# Patient Record
Sex: Female | Born: 1935 | Race: White | Hispanic: No | Smoking: Current every day smoker
Health system: Southern US, Community
[De-identification: ages and names within clinical notes are randomized; demographics above are authoritative.]

## PROBLEM LIST (undated history)

## (undated) DIAGNOSIS — I10 Essential (primary) hypertension: Secondary | ICD-10-CM

## (undated) DIAGNOSIS — Z972 Presence of dental prosthetic device (complete) (partial): Secondary | ICD-10-CM

## (undated) DIAGNOSIS — C50919 Malignant neoplasm of unspecified site of unspecified female breast: Secondary | ICD-10-CM

## (undated) DIAGNOSIS — Z86718 Personal history of other venous thrombosis and embolism: Secondary | ICD-10-CM

## (undated) DIAGNOSIS — R413 Other amnesia: Secondary | ICD-10-CM

## (undated) HISTORY — PX: NO PAST SURGERIES: SHX2092

---

## 2005-05-20 ENCOUNTER — Ambulatory Visit: Payer: Self-pay | Admitting: Family Medicine

## 2005-06-04 ENCOUNTER — Ambulatory Visit: Payer: Self-pay | Admitting: Family Medicine

## 2007-08-09 ENCOUNTER — Ambulatory Visit: Payer: Self-pay | Admitting: Emergency Medicine

## 2012-03-30 ENCOUNTER — Ambulatory Visit: Payer: Self-pay

## 2012-03-30 LAB — PROTIME-INR
INR: 2.6
Prothrombin Time: 27.7 secs — ABNORMAL HIGH (ref 11.5–14.7)

## 2018-11-26 ENCOUNTER — Emergency Department: Payer: Medicare Other

## 2018-11-26 ENCOUNTER — Encounter
Admission: EM | Disposition: A | Discharge status: Skilled Nursing Facility | Payer: Self-pay | Source: Home / Self Care | Attending: Internal Medicine

## 2018-11-26 ENCOUNTER — Inpatient Hospital Stay: Payer: Medicare Other | Admitting: Anesthesiology

## 2018-11-26 ENCOUNTER — Other Ambulatory Visit: Payer: Self-pay

## 2018-11-26 ENCOUNTER — Inpatient Hospital Stay: Payer: Medicare Other

## 2018-11-26 ENCOUNTER — Inpatient Hospital Stay
Admission: EM | Admit: 2018-11-26 | Discharge: 2018-11-30 | DRG: 481 | Disposition: A | Discharge status: Skilled Nursing Facility | Payer: Medicare Other | Attending: Internal Medicine | Admitting: Internal Medicine

## 2018-11-26 ENCOUNTER — Encounter: Payer: Self-pay | Admitting: Emergency Medicine

## 2018-11-26 DIAGNOSIS — D62 Acute posthemorrhagic anemia: Secondary | ICD-10-CM | POA: Diagnosis not present

## 2018-11-26 DIAGNOSIS — Z8249 Family history of ischemic heart disease and other diseases of the circulatory system: Secondary | ICD-10-CM

## 2018-11-26 DIAGNOSIS — Z01811 Encounter for preprocedural respiratory examination: Secondary | ICD-10-CM | POA: Diagnosis present

## 2018-11-26 DIAGNOSIS — F0390 Unspecified dementia without behavioral disturbance: Secondary | ICD-10-CM | POA: Diagnosis present

## 2018-11-26 DIAGNOSIS — W001XXA Fall from stairs and steps due to ice and snow, initial encounter: Secondary | ICD-10-CM | POA: Diagnosis present

## 2018-11-26 DIAGNOSIS — N183 Chronic kidney disease, stage 3 (moderate): Secondary | ICD-10-CM | POA: Diagnosis present

## 2018-11-26 DIAGNOSIS — S7222XA Displaced subtrochanteric fracture of left femur, initial encounter for closed fracture: Secondary | ICD-10-CM | POA: Diagnosis present

## 2018-11-26 DIAGNOSIS — I129 Hypertensive chronic kidney disease with stage 1 through stage 4 chronic kidney disease, or unspecified chronic kidney disease: Secondary | ICD-10-CM | POA: Diagnosis present

## 2018-11-26 DIAGNOSIS — Z86718 Personal history of other venous thrombosis and embolism: Secondary | ICD-10-CM | POA: Diagnosis not present

## 2018-11-26 DIAGNOSIS — S72002A Fracture of unspecified part of neck of left femur, initial encounter for closed fracture: Secondary | ICD-10-CM | POA: Diagnosis present

## 2018-11-26 DIAGNOSIS — Z419 Encounter for procedure for purposes other than remedying health state, unspecified: Secondary | ICD-10-CM

## 2018-11-26 DIAGNOSIS — Z79899 Other long term (current) drug therapy: Secondary | ICD-10-CM | POA: Diagnosis not present

## 2018-11-26 DIAGNOSIS — S42202A Unspecified fracture of upper end of left humerus, initial encounter for closed fracture: Secondary | ICD-10-CM | POA: Diagnosis present

## 2018-11-26 DIAGNOSIS — Z7901 Long term (current) use of anticoagulants: Secondary | ICD-10-CM

## 2018-11-26 DIAGNOSIS — N179 Acute kidney failure, unspecified: Secondary | ICD-10-CM | POA: Diagnosis present

## 2018-11-26 DIAGNOSIS — R509 Fever, unspecified: Secondary | ICD-10-CM | POA: Diagnosis present

## 2018-11-26 DIAGNOSIS — Z853 Personal history of malignant neoplasm of breast: Secondary | ICD-10-CM | POA: Diagnosis not present

## 2018-11-26 DIAGNOSIS — Y92009 Unspecified place in unspecified non-institutional (private) residence as the place of occurrence of the external cause: Secondary | ICD-10-CM | POA: Diagnosis not present

## 2018-11-26 DIAGNOSIS — F17200 Nicotine dependence, unspecified, uncomplicated: Secondary | ICD-10-CM | POA: Diagnosis present

## 2018-11-26 HISTORY — PX: INTRAMEDULLARY (IM) NAIL INTERTROCHANTERIC: SHX5875

## 2018-11-26 HISTORY — DX: Malignant neoplasm of unspecified site of unspecified female breast: C50.919

## 2018-11-26 LAB — HEMOGLOBIN A1C
HEMOGLOBIN A1C: 5.2 % (ref 4.8–5.6)
Mean Plasma Glucose: 102.54 mg/dL

## 2018-11-26 LAB — CBC WITH DIFFERENTIAL/PLATELET
Abs Immature Granulocytes: 0.11 10*3/uL — ABNORMAL HIGH (ref 0.00–0.07)
BASOS PCT: 0 %
Basophils Absolute: 0.1 10*3/uL (ref 0.0–0.1)
Eosinophils Absolute: 0.4 10*3/uL (ref 0.0–0.5)
Eosinophils Relative: 2 %
HCT: 40.9 % (ref 36.0–46.0)
Hemoglobin: 12.8 g/dL (ref 12.0–15.0)
Immature Granulocytes: 1 %
Lymphocytes Relative: 8 %
Lymphs Abs: 1.4 10*3/uL (ref 0.7–4.0)
MCH: 28.6 pg (ref 26.0–34.0)
MCHC: 31.3 g/dL (ref 30.0–36.0)
MCV: 91.3 fL (ref 80.0–100.0)
Monocytes Absolute: 0.9 10*3/uL (ref 0.1–1.0)
Monocytes Relative: 5 %
NEUTROS ABS: 14.4 10*3/uL — AB (ref 1.7–7.7)
Neutrophils Relative %: 84 %
Platelets: 208 10*3/uL (ref 150–400)
RBC: 4.48 MIL/uL (ref 3.87–5.11)
RDW: 13.2 % (ref 11.5–15.5)
WBC: 17.1 10*3/uL — ABNORMAL HIGH (ref 4.0–10.5)
nRBC: 0 % (ref 0.0–0.2)

## 2018-11-26 LAB — BASIC METABOLIC PANEL
Anion gap: 9 (ref 5–15)
BUN: 17 mg/dL (ref 8–23)
CO2: 22 mmol/L (ref 22–32)
Calcium: 8.6 mg/dL — ABNORMAL LOW (ref 8.9–10.3)
Chloride: 108 mmol/L (ref 98–111)
Creatinine, Ser: 1.18 mg/dL — ABNORMAL HIGH (ref 0.44–1.00)
GFR calc Af Amer: 50 mL/min — ABNORMAL LOW (ref >60)
GFR calc non Af Amer: 44 mL/min — ABNORMAL LOW (ref >60)
GLUCOSE: 179 mg/dL — AB (ref 70–99)
Potassium: 4 mmol/L (ref 3.5–5.1)
Sodium: 139 mmol/L (ref 135–145)

## 2018-11-26 LAB — PROTIME-INR
INR: 1
Prothrombin Time: 13.1 seconds (ref 11.4–15.2)

## 2018-11-26 SURGERY — FIXATION, FRACTURE, INTERTROCHANTERIC, WITH INTRAMEDULLARY ROD
Anesthesia: General | Laterality: Left

## 2018-11-26 MED ORDER — DIPHENHYDRAMINE HCL 12.5 MG/5ML PO ELIX: 12.5000 mg | ORAL_SOLUTION | ORAL | Status: DC | PRN

## 2018-11-26 MED ORDER — MORPHINE SULFATE (PF) 4 MG/ML IV SOLN
4.0000 mg | Freq: Once | INTRAVENOUS | Status: AC
  Administered 2018-11-26: 4 mg via INTRAVENOUS
  Filled 2018-11-26: qty 1

## 2018-11-26 MED ORDER — TRAMADOL HCL 50 MG PO TABS
50.0000 mg | ORAL_TABLET | Freq: Four times a day (QID) | ORAL | Status: DC | PRN
  Administered 2018-11-27 – 2018-11-30 (×2): 50 mg via ORAL
  Filled 2018-11-26 (×2): qty 1

## 2018-11-26 MED ORDER — ROCURONIUM BROMIDE 100 MG/10ML IV SOLN
INTRAVENOUS | Status: DC | PRN
  Administered 2018-11-26: 5 mg via INTRAVENOUS
  Administered 2018-11-26: 30 mg via INTRAVENOUS
  Administered 2018-11-26: 15 mg via INTRAVENOUS

## 2018-11-26 MED ORDER — DOCUSATE SODIUM 100 MG PO CAPS
100.0000 mg | ORAL_CAPSULE | Freq: Two times a day (BID) | ORAL | Status: DC
  Administered 2018-11-26 – 2018-11-30 (×8): 100 mg via ORAL
  Filled 2018-11-26 (×8): qty 1

## 2018-11-26 MED ORDER — OXYCODONE HCL 5 MG PO TABS
2.5000 mg | ORAL_TABLET | ORAL | Status: DC | PRN
  Administered 2018-11-27 – 2018-11-30 (×10): 5 mg via ORAL
  Filled 2018-11-26 (×10): qty 1

## 2018-11-26 MED ORDER — SODIUM CHLORIDE 0.9 % IV SOLN
INTRAVENOUS | Status: DC | PRN
  Administered 2018-11-26: 25 ug/min via INTRAVENOUS

## 2018-11-26 MED ORDER — ACETAMINOPHEN 325 MG PO TABS
325.0000 mg | ORAL_TABLET | Freq: Four times a day (QID) | ORAL | Status: DC | PRN
  Administered 2018-11-28: 650 mg via ORAL
  Filled 2018-11-26: qty 2

## 2018-11-26 MED ORDER — BISACODYL 10 MG RE SUPP
10.0000 mg | Freq: Every day | RECTAL | Status: DC | PRN
  Administered 2018-11-29: 10 mg via RECTAL
  Filled 2018-11-26: qty 1

## 2018-11-26 MED ORDER — SODIUM CHLORIDE 0.9 % IV SOLN
INTRAVENOUS | Status: DC
  Administered 2018-11-26: 23:00:00 via INTRAVENOUS

## 2018-11-26 MED ORDER — MIDAZOLAM HCL 2 MG/2ML IJ SOLN
INTRAMUSCULAR | Status: AC
  Filled 2018-11-26: qty 2

## 2018-11-26 MED ORDER — CEFAZOLIN SODIUM 1 G IJ SOLR
INTRAMUSCULAR | Status: AC
  Filled 2018-11-26: qty 20

## 2018-11-26 MED ORDER — ACETAMINOPHEN 650 MG RE SUPP: 650.0000 mg | Freq: Four times a day (QID) | RECTAL | Status: DC | PRN

## 2018-11-26 MED ORDER — METOCLOPRAMIDE HCL 10 MG PO TABS: 5.0000 mg | ORAL_TABLET | Freq: Three times a day (TID) | ORAL | Status: DC | PRN

## 2018-11-26 MED ORDER — AMLODIPINE BESYLATE 5 MG PO TABS: 5.0000 mg | ORAL_TABLET | Freq: Every day | ORAL | Status: DC

## 2018-11-26 MED ORDER — ONDANSETRON HCL 4 MG/2ML IJ SOLN
INTRAMUSCULAR | Status: DC | PRN
  Administered 2018-11-26: 4 mg via INTRAVENOUS

## 2018-11-26 MED ORDER — METHOCARBAMOL 500 MG PO TABS: 500.0000 mg | ORAL_TABLET | Freq: Four times a day (QID) | ORAL | Status: DC | PRN

## 2018-11-26 MED ORDER — LIDOCAINE HCL (PF) 2 % IJ SOLN
INTRAMUSCULAR | Status: AC
  Filled 2018-11-26: qty 10

## 2018-11-26 MED ORDER — SUGAMMADEX SODIUM 200 MG/2ML IV SOLN
INTRAVENOUS | Status: DC | PRN
  Administered 2018-11-26: 200 mg via INTRAVENOUS

## 2018-11-26 MED ORDER — FLEET ENEMA 7-19 GM/118ML RE ENEM: 1.0000 | ENEMA | Freq: Once | RECTAL | Status: DC | PRN

## 2018-11-26 MED ORDER — FENTANYL CITRATE (PF) 100 MCG/2ML IJ SOLN
INTRAMUSCULAR | Status: DC | PRN
  Administered 2018-11-26: 50 ug via INTRAVENOUS
  Administered 2018-11-26 (×2): 25 ug via INTRAVENOUS

## 2018-11-26 MED ORDER — ONDANSETRON HCL 4 MG/2ML IJ SOLN: 4.0000 mg | Freq: Four times a day (QID) | INTRAMUSCULAR | Status: DC | PRN

## 2018-11-26 MED ORDER — RIVAROXABAN 10 MG PO TABS
10.0000 mg | ORAL_TABLET | Freq: Every evening | ORAL | Status: DC
  Administered 2018-11-27 – 2018-11-30 (×4): 10 mg via ORAL
  Filled 2018-11-26 (×4): qty 1

## 2018-11-26 MED ORDER — GLYCOPYRROLATE 0.2 MG/ML IJ SOLN
INTRAMUSCULAR | Status: DC | PRN
  Administered 2018-11-26: 0.2 mg via INTRAVENOUS

## 2018-11-26 MED ORDER — ONDANSETRON HCL 4 MG/2ML IJ SOLN
INTRAMUSCULAR | Status: AC
  Filled 2018-11-26: qty 2

## 2018-11-26 MED ORDER — FENTANYL CITRATE (PF) 100 MCG/2ML IJ SOLN
INTRAMUSCULAR | Status: AC
  Filled 2018-11-26: qty 2

## 2018-11-26 MED ORDER — SENNOSIDES-DOCUSATE SODIUM 8.6-50 MG PO TABS
1.0000 | ORAL_TABLET | Freq: Every evening | ORAL | Status: DC | PRN
  Administered 2018-11-28: 1 via ORAL
  Filled 2018-11-26: qty 1

## 2018-11-26 MED ORDER — PROPOFOL 10 MG/ML IV BOLUS
INTRAVENOUS | Status: DC | PRN
  Administered 2018-11-26: 80 mg via INTRAVENOUS

## 2018-11-26 MED ORDER — ONDANSETRON HCL 4 MG PO TABS: 4.0000 mg | ORAL_TABLET | Freq: Four times a day (QID) | ORAL | Status: DC | PRN

## 2018-11-26 MED ORDER — LACTATED RINGERS IV SOLN
INTRAVENOUS | Status: DC | PRN
  Administered 2018-11-26 (×2): via INTRAVENOUS

## 2018-11-26 MED ORDER — KETAMINE HCL 50 MG/ML IJ SOLN
INTRAMUSCULAR | Status: AC
  Filled 2018-11-26: qty 10

## 2018-11-26 MED ORDER — EPHEDRINE SULFATE 50 MG/ML IJ SOLN
INTRAMUSCULAR | Status: DC | PRN
  Administered 2018-11-26: 5 mg via INTRAVENOUS
  Administered 2018-11-26 (×2): 10 mg via INTRAVENOUS
  Administered 2018-11-26 (×2): 5 mg via INTRAVENOUS
  Administered 2018-11-26: 10 mg via INTRAVENOUS

## 2018-11-26 MED ORDER — PROPOFOL 10 MG/ML IV BOLUS
INTRAVENOUS | Status: AC
  Filled 2018-11-26: qty 20

## 2018-11-26 MED ORDER — OXYCODONE HCL 5 MG PO TABS
5.0000 mg | ORAL_TABLET | ORAL | Status: DC | PRN
  Administered 2018-11-27: 10 mg via ORAL
  Administered 2018-11-28: 5 mg via ORAL
  Filled 2018-11-26: qty 2
  Filled 2018-11-26: qty 1

## 2018-11-26 MED ORDER — LORATADINE 10 MG PO TABS: 10.0000 mg | ORAL_TABLET | Freq: Every day | ORAL | Status: DC | PRN

## 2018-11-26 MED ORDER — PHENYLEPHRINE HCL 10 MG/ML IJ SOLN
INTRAMUSCULAR | Status: DC | PRN
  Administered 2018-11-26 (×3): 100 ug via INTRAVENOUS

## 2018-11-26 MED ORDER — METHOCARBAMOL 1000 MG/10ML IJ SOLN
500.0000 mg | Freq: Four times a day (QID) | INTRAVENOUS | Status: DC | PRN
  Filled 2018-11-26: qty 5

## 2018-11-26 MED ORDER — HYDROMORPHONE HCL 1 MG/ML IJ SOLN: 0.2500 mg | INTRAMUSCULAR | Status: DC | PRN

## 2018-11-26 MED ORDER — ACETAMINOPHEN 325 MG PO TABS: 650.0000 mg | ORAL_TABLET | Freq: Four times a day (QID) | ORAL | Status: DC | PRN

## 2018-11-26 MED ORDER — LIDOCAINE HCL (CARDIAC) PF 100 MG/5ML IV SOSY
PREFILLED_SYRINGE | INTRAVENOUS | Status: DC | PRN
  Administered 2018-11-26: 60 mg via INTRAVENOUS

## 2018-11-26 MED ORDER — MORPHINE SULFATE (PF) 2 MG/ML IV SOLN
2.0000 mg | INTRAVENOUS | Status: DC | PRN
  Administered 2018-11-26: 2 mg via INTRAVENOUS
  Filled 2018-11-26: qty 1

## 2018-11-26 MED ORDER — METOCLOPRAMIDE HCL 5 MG/ML IJ SOLN: 5.0000 mg | Freq: Three times a day (TID) | INTRAMUSCULAR | Status: DC | PRN

## 2018-11-26 MED ORDER — AMLODIPINE BESYLATE 5 MG PO TABS
5.0000 mg | ORAL_TABLET | Freq: Every day | ORAL | Status: DC
  Administered 2018-11-27 – 2018-11-28 (×2): 5 mg via ORAL
  Filled 2018-11-26 (×3): qty 1

## 2018-11-26 MED ORDER — ROCURONIUM BROMIDE 50 MG/5ML IV SOLN
INTRAVENOUS | Status: AC
  Filled 2018-11-26: qty 1

## 2018-11-26 MED ORDER — DEXAMETHASONE SODIUM PHOSPHATE 10 MG/ML IJ SOLN
INTRAMUSCULAR | Status: DC | PRN
  Administered 2018-11-26: 5 mg via INTRAVENOUS

## 2018-11-26 MED ORDER — CEFAZOLIN SODIUM-DEXTROSE 2-4 GM/100ML-% IV SOLN
2.0000 g | Freq: Four times a day (QID) | INTRAVENOUS | Status: AC
  Administered 2018-11-27 (×3): 2 g via INTRAVENOUS
  Filled 2018-11-26 (×3): qty 100

## 2018-11-26 MED ORDER — SUGAMMADEX SODIUM 200 MG/2ML IV SOLN
INTRAVENOUS | Status: AC
  Filled 2018-11-26: qty 2

## 2018-11-26 MED ORDER — ONDANSETRON HCL 4 MG PO TABS
4.0000 mg | ORAL_TABLET | Freq: Four times a day (QID) | ORAL | Status: DC | PRN
  Administered 2018-11-29: 4 mg via ORAL
  Filled 2018-11-26: qty 1

## 2018-11-26 MED ORDER — ACETAMINOPHEN 500 MG PO TABS
1000.0000 mg | ORAL_TABLET | Freq: Three times a day (TID) | ORAL | Status: AC
  Administered 2018-11-26 – 2018-11-27 (×4): 1000 mg via ORAL
  Filled 2018-11-26 (×4): qty 2

## 2018-11-26 MED ORDER — SUCCINYLCHOLINE CHLORIDE 20 MG/ML IJ SOLN
INTRAMUSCULAR | Status: DC | PRN
  Administered 2018-11-26: 100 mg via INTRAVENOUS

## 2018-11-26 MED ORDER — CEFAZOLIN SODIUM-DEXTROSE 2-3 GM-%(50ML) IV SOLR
INTRAVENOUS | Status: DC | PRN
  Administered 2018-11-26: 2 g via INTRAVENOUS

## 2018-11-26 MED ORDER — BUPIVACAINE LIPOSOME 1.3 % IJ SUSP
INTRAMUSCULAR | Status: DC | PRN
  Administered 2018-11-26: 50 mL

## 2018-11-26 MED ORDER — KETAMINE HCL 50 MG/ML IJ SOLN
INTRAMUSCULAR | Status: DC | PRN
  Administered 2018-11-26: 25 mg via INTRAMUSCULAR

## 2018-11-26 MED ORDER — OXYCODONE HCL 5 MG PO TABS: 5.0000 mg | ORAL_TABLET | ORAL | Status: DC | PRN

## 2018-11-26 MED ORDER — DEXMEDETOMIDINE HCL IN NACL 200 MCG/50ML IV SOLN
INTRAVENOUS | Status: DC | PRN
  Administered 2018-11-26: .4 ug/kg/h via INTRAVENOUS

## 2018-11-26 MED ORDER — SODIUM CHLORIDE 0.9 % IV SOLN
INTRAVENOUS | Status: DC | PRN
  Administered 2018-11-26: 1000 mL

## 2018-11-26 MED ORDER — DEXAMETHASONE SODIUM PHOSPHATE 10 MG/ML IJ SOLN
INTRAMUSCULAR | Status: AC
  Filled 2018-11-26: qty 1

## 2018-11-26 SURGICAL SUPPLY — 51 items
"PENCIL ELECTRO HAND CTR " (MISCELLANEOUS) ×1 IMPLANT
BIT DRILL AO GAMMA 4.2X180 (BIT) ×4 IMPLANT
BLADE SURG 15 STRL LF DISP TIS (BLADE) ×1 IMPLANT
BLADE SURG 15 STRL SS (BLADE) ×2
CANISTER SUCT 1200ML W/VALVE (MISCELLANEOUS) ×3 IMPLANT
CHLORAPREP W/TINT 26ML (MISCELLANEOUS) ×3 IMPLANT
COVER WAND RF STERILE (DRAPES) ×1 IMPLANT
DRAPE SHEET LG 3/4 BI-LAMINATE (DRAPES) ×3 IMPLANT
DRAPE SURG 17X11 SM STRL (DRAPES) ×2 IMPLANT
DRAPE U-SHAPE 47X51 STRL (DRAPES) ×6 IMPLANT
DRSG OPSITE POSTOP 3X4 (GAUZE/BANDAGES/DRESSINGS) ×9 IMPLANT
ELECT REM PT RETURN 9FT ADLT (ELECTROSURGICAL) ×3
ELECTRODE REM PT RTRN 9FT ADLT (ELECTROSURGICAL) ×1 IMPLANT
GLOVE BIOGEL PI IND STRL 8 (GLOVE) ×1 IMPLANT
GLOVE BIOGEL PI INDICATOR 8 (GLOVE) ×2
GLOVE SURG SYN 7.5  E (GLOVE) ×4
GLOVE SURG SYN 7.5 E (GLOVE) ×2 IMPLANT
GLOVE SURG SYN 7.5 PF PI (GLOVE) ×1 IMPLANT
GOWN STRL REUS W/ TWL LRG LVL3 (GOWN DISPOSABLE) ×1 IMPLANT
GOWN STRL REUS W/ TWL XL LVL3 (GOWN DISPOSABLE) ×1 IMPLANT
GOWN STRL REUS W/TWL LRG LVL3 (GOWN DISPOSABLE) ×2
GOWN STRL REUS W/TWL XL LVL3 (GOWN DISPOSABLE) ×2
GUIDEROD T2 3X1000 (ROD) ×2 IMPLANT
HOLDER FOLEY CATH W/STRAP (MISCELLANEOUS) ×2 IMPLANT
K-WIRE  3.2X450M STR (WIRE) ×2
K-WIRE 3.2X450M STR (WIRE) ×1
KIT PATIENT CARE HANA TABLE (KITS) ×3 IMPLANT
KIT TURNOVER KIT A (KITS) ×3 IMPLANT
KWIRE 3.2X450M STR (WIRE) IMPLANT
MAT ABSORB  FLUID 56X50 GRAY (MISCELLANEOUS) ×2
MAT ABSORB FLUID 56X50 GRAY (MISCELLANEOUS) ×2 IMPLANT
NAIL LONG 011X400X125 (Nail) ×2 IMPLANT
NDL FILTER BLUNT 18X1 1/2 (NEEDLE) ×1 IMPLANT
NEEDLE FILTER BLUNT 18X 1/2SAF (NEEDLE) ×2
NEEDLE FILTER BLUNT 18X1 1/2 (NEEDLE) ×1 IMPLANT
NEEDLE HYPO 22GX1.5 SAFETY (NEEDLE) ×3 IMPLANT
NS IRRIG 1000ML POUR BTL (IV SOLUTION) ×3 IMPLANT
PACK HIP COMPR (MISCELLANEOUS) ×3 IMPLANT
PENCIL ELECTRO HAND CTR (MISCELLANEOUS) ×3 IMPLANT
REAMER SHAFT BIXCUT (INSTRUMENTS) ×2 IMPLANT
SCREW LAG GAMMA 3 95MM (Screw) ×2 IMPLANT
SCREW LOCKING T2 F/T  5MMX50MM (Screw) ×2 IMPLANT
SCREW LOCKING T2 F/T 5MMX50MM (Screw) IMPLANT
SLEEVE SCD COMPRESS THIGH MED (MISCELLANEOUS) ×2 IMPLANT
SLING ARM M TX990204 (SOFTGOODS) ×2 IMPLANT
STAPLER SKIN PROX 35W (STAPLE) ×3 IMPLANT
SUT VIC AB 2-0 CT2 27 (SUTURE) ×3 IMPLANT
SUT VICRYL 0 27 CT2 27 ABS (SUTURE) ×2 IMPLANT
SYR 10ML LL (SYRINGE) ×3 IMPLANT
SYR 30ML LL (SYRINGE) ×3 IMPLANT
TAPE CLOTH 3X10 WHT NS LF (GAUZE/BANDAGES/DRESSINGS) ×6 IMPLANT

## 2018-11-26 NOTE — Transfer of Care (Signed)
Immediate Anesthesia Transfer of Care Note  Patient: Cassandra Maxwell  Procedure(s) Performed: INTRAMEDULLARY (IM) NAIL INTERTROCHANTRIC (Left )  Patient Location: PACU  Anesthesia Type:General  Level of Consciousness: awake  Airway & Oxygen Therapy: Patient Spontanous Breathing and Patient connected to face mask oxygen  Post-op Assessment: Report given to RN and Post -op Vital signs reviewed and stable  Post vital signs: Reviewed  Last Vitals:  Vitals Value Taken Time  BP 101/57 11/26/2018  9:05 PM  Temp 36.5 C 11/26/2018  9:05 PM  Pulse 88 11/26/2018  9:06 PM  Resp 19 11/26/2018  9:06 PM  SpO2 99 % 11/26/2018  9:06 PM  Vitals shown include unvalidated device data.  Last Pain:  Vitals:   11/26/18 1319  TempSrc:   PainSc: 10-Worst pain ever         Complications: No apparent anesthesia complications

## 2018-11-26 NOTE — ED Notes (Signed)
Pt transported to room 152 

## 2018-11-26 NOTE — ED Notes (Signed)
Patient transported to CT, X-ray. 

## 2018-11-26 NOTE — ED Provider Notes (Addendum)
[facility] Emergency Department Provider Note  ____________________________________________   I have reviewed the triage vital signs and the nursing notes. Where available I have reviewed prior notes and, if possible and indicated, outside hospital notes.    HISTORY  Chief Complaint Fall    HPI Cassandra Maxwell is a 82 y.o. female was on Coumadin but she is not sure why, states that she went out this morning in the freezing cold and slipped on an icy step and fell a few steps.  Landed on her left side.  Did not pass out did not hit her head, states that she was not down for a long time her son came out to see her.  Patient states she has pain to her left shoulder which hurts when she moves it, as well as the left hip.  Denies any knee or ankle pain.  Denies any nausea or vomiting chest pain shortness of breath.  This was a non-syncopal event, she slipped.  Has clear recollection of falling has not had vomiting or other complaints since that time is compliant with her medications including Coumadin She did take fentanyl from EMS prior to arrival.    History reviewed. No pertinent past medical history.  There are no active problems to display for this patient.   History reviewed. No pertinent surgical history.  Prior to Admission medications   Not on File    Allergies Patient has no known allergies.  History reviewed. No pertinent family history.  Social History Social History   Tobacco Use  . Smoking status: Current Every Day Smoker  . Smokeless tobacco: Never Used  Substance Use Topics  . Alcohol use: Never    Frequency: Never  . Drug use: Never    Review of Systems Constitutional: No fever/chills Eyes: No visual changes. ENT: No sore throat. No stiff neck no neck pain Cardiovascular: Denies chest pain. Respiratory: Denies shortness of breath. Gastrointestinal:   no vomiting.  No diarrhea.  No constipation. Genitourinary: Negative for  dysuria. Musculoskeletal: Negative lower extremity swelling Skin: Negative for rash. Neurological: Negative for severe headaches, focal weakness or numbness.   ____________________________________________   PHYSICAL EXAM:  VITAL SIGNS: ED Triage Vitals  Enc Vitals Group     BP 11/26/18 0935 (!) 160/71     Pulse --      Resp 11/26/18 0935 19     Temp 11/26/18 0935 (!) 96.4 F (35.8 C)     Temp Source 11/26/18 0935 Oral     SpO2 11/26/18 0935 98 %     Weight 11/26/18 0936 180 lb (81.6 kg)     Height 11/26/18 0936 5\' 4"  (1.626 m)     Head Circumference --      Peak Flow --      Pain Score 11/26/18 0936 6     Pain Loc --      Pain Edu? --      Excl. in White? --     Constitutional: Alert and oriented. Well appearing and in no acute distress. Eyes: Conjunctivae are normal Head: Atraumatic HEENT: No congestion/rhinnorhea. Mucous membranes are moist.  Oropharynx non-erythematous Neck:   Nontender with no meningismus, no masses, no stridor Cardiovascular: Normal rate, regular rhythm. Grossly normal heart sounds.  Good peripheral circulation. Respiratory: Normal respiratory effort.  No retractions. Lungs CTAB. Abdominal: Soft and nontender. No distention. No guarding no rebound Back:  There is no focal tenderness or step off.  there is no midline tenderness there are no  lesions noted. there is no CVA tenderness Musculoskeletal: Palpation of the left hip tender with ranging the left hip no significant knee or ankle discomfort noted, also tenderness noted to the left shoulder with no evidence of dislocation, limited range of motion secondary to discomfort. No joint effusions, no DVT signs strong distal pulses no edema Neurologic:  Normal speech and language. No gross focal neurologic deficits are appreciated.  Skin:  Skin is warm, dry and intact. No rash noted. Psychiatric: Mood and affect are normal. Speech and behavior are normal.  ____________________________________________    LABS (all labs ordered are listed, but only abnormal results are displayed)  Labs Reviewed  CBC WITH DIFFERENTIAL/PLATELET  BASIC METABOLIC PANEL  PROTIME-INR    Pertinent labs  results that were available during my care of the patient were reviewed by me and considered in my medical decision making (see chart for details). ____________________________________________  EKG  I personally interpreted any EKGs ordered by me or triage LAD noted, normal sinus rhythm, and LAFB, RBBB, no acute ST or STD, ____________________________________________  RADIOLOGY  Pertinent labs & imaging results that were available during my care of the patient were reviewed by me and considered in my medical decision making (see chart for details). If possible, patient and/or family made aware of any abnormal findings.  No results found. ____________________________________________    PROCEDURES  Procedure(s) performed: None  Procedures  Critical Care performed: None  ____________________________________________   INITIAL IMPRESSION / ASSESSMENT AND PLAN / ED COURSE  Pertinent labs & imaging results that were available during my care of the patient were reviewed by me and considered in my medical decision making (see chart for details).  Patient here after a non-syncopal fall with no significant downtime, even though she is sure she did not hit her head I am still going to get a CT scan as sometimes in the drama of a fall, it is possible to hit her head without noticing in either way they will be a disorder of aspect to any fall making 82 year old on Coumadin prone to head bleeds and we will ensure that she does not have one.  We will check basic blood work including INR, patient has pain to her left shoulder and left hip it is controlled at this time she understands if she needs pain medication is available To her.  We obtain x-ray of her afflicted body parts and reassess    ____________________________________________   FINAL CLINICAL IMPRESSION(S) / ED DIAGNOSES  Final diagnoses:  None      This chart was dictated using voice recognition software.  Despite best efforts to proofread,  errors can occur which can change meaning.      Schuyler Amor, MD 11/26/18 8421    Schuyler Amor, MD 11/26/18 [phone]

## 2018-11-26 NOTE — ED Triage Notes (Signed)
Pt to ER via EMS after falling from steps on porch.  Pt denies hitting head, pt states pain to left shoulder and left hip. PT give 25mcg Fentanyl en route by EMS

## 2018-11-26 NOTE — Op Note (Signed)
DATE OF SURGERY: 11/26/2018  PREOPERATIVE DIAGNOSIS:  1. Left subrtrochanteric hip fracture 2. Left proximal humerus fracture  POSTOPERATIVE DIAGNOSIS:  1. Left subrtrochanteric hip fracture 2. Left proximal humerus fracture  PROCEDURE:  1. Intramedullary nailing of left femur with cephalomedullary device 2. Closed management of left proximal humerus fracture without manipulation  SURGEON: Cato Mulligan, MD  ANESTHESIA: Gen  EBL: 100 cc  IVF: per anesthesia record  COMPONENTS:  Stryker Long Gamma Nail: 11x473mm; 98mm lag screw; 2 distal interolocking screws.   INDICATIONS: Cassandra Maxwell is a 82 y.o. female who sustained an subtrochanteric femur fracture and a proximal humerus fracture after a fall. Risks and benefits of intramedullary nailing were explained to the patient and family (who were PoA). Risks include but are not limited to bleeding, infection, injury to tissues, nerves, vessels, nonunion/malunion, hardware failure, limb length discrepancy/hip rotation mismatch and risks of anesthesia. Additionally, we agreed to pursue non-operative management of the proximal humerus fracture. The patient and family understand these risks, have completed an informed consent, and wish to proceed.   PROCEDURE:  The patient was brought into the operating room. After administering anesthesia, the patient was placed in the supine position on the Hana table. The uninjured leg was placed in an extended position while the injured lower extremity was placed in longitudinal traction. The fracture was almost reduced using longitudinal traction and internal rotation. The lateral aspect of the right hip and thigh were prepped with ChloraPrep solution before being draped sterilely. Preoperative IV antibiotics were administered. A timeout was performed to verify the appropriate surgical site, patient, and procedure.    The greater trochanter was identified and an approximately 6 cm incision was made  about 3 fingerbreadths above the tip of the greater trochanter. The incision was carried down through the subcutaneous tissues to expose the gluteal fascia. This was split the length of the incision, providing access to the tip of the trochanter. Under fluoroscopic guidance, a guidewire was drilled through the tip of the trochanter into the proximal metaphysis to the level of the lesser trochanter. After verifying its position fluoroscopically in AP and lateral projections, it was overreamed with the opening reamer to the level of the lesser trochanter. A guidewire was passed down through the femoral canal to the supracondylar region. The adequacy of guidewire position was verified fluoroscopically in AP and lateral projections before the length of the guidewire within the canal was measured and a nail of appropriate length was selected.   Next, a lateral incision with just distal to the apex of the greater trochanter was made.  The IT band was split.  A reduction clamp was used to reduce the fracture and take the flexion and rotation out of the proximal segment.  Improved alignment was confirmed fluoroscopically in both AP and lateral views.  The guidewire was then overreamed sequentially using the flexible reamers. The Stryker Gamma Nail was selected and advanced to the appropriate depth as verified fluoroscopically.    The guide system for the lag screw was positioned and advanced through lateral incision. The guidewire was drilled up through the femoral nail and into the femoral neck to rest within 5 mm of subchondral bone. After verifying its position in the femoral neck and head in both AP and lateral projections, the guidewire was measured and appropriate sized lag screw was selected. The guidewire was overreamed to the appropriate depth before the lag screw was inserted and advanced to the appropriate depth as verified fluoroscopically in AP and  lateral projections. The set screw was placed  appropriately. Again, the adequacy of hardware position and fracture reduction was verified fluoroscopically in AP and lateral projections.   Attention was directed distally. Using the "perfect circle" technique, the leg and fluoroscopy machine were positioned appropriately. A 2cm stab incision was made over the skin and IT band at the appropriate point before the drill bit was advanced through the cortex and across the static hole of the nail. This was repeated for the oblong hole as well. Appropriate screw lengths were determined with a measuring guide. Two distal interlocking screws were placed. Again the adequacy of screw position was verified fluoroscopically in AP and lateral projections.   The wounds were irrigated thoroughly with sterile saline solution. Local anesthetic was injected into the wounds. Deep fascia of IT band was closed with 0-Vicryl. The subcutaneous tissues were closed using 2-0 Vicryl interrupted sutures. The skin was closed using staples. Sterile occlusive dressings were applied to all wounds.  A sling was placed on the left shoulder without any further manipulation of the shoulder.  The patient was then transferred to the recovery room in satisfactory condition.   POSTOPERATIVE PLAN: The patient will be WBAT on the left lower extremity.  She will be nonweightbearing on the left upper extremity and remain in the sling.  Resume home Xarelto for DVT prophylaxis.  Perioperative IV antibiotics x 24 hours. PT/OT on POD#1.

## 2018-11-26 NOTE — Anesthesia Post-op Follow-up Note (Signed)
Anesthesia QCDR form completed.        

## 2018-11-26 NOTE — Anesthesia Preprocedure Evaluation (Signed)
Anesthesia Evaluation  Patient identified by MRN, date of birth, ID band Patient awake    Reviewed: Allergy & Precautions, H&P , NPO status , Patient's Chart, lab work & pertinent test results  Airway Mallampati: III  TM Distance: >3 FB     Dental  (+) Edentulous Upper, Edentulous Lower   Pulmonary Current Smoker,           Cardiovascular negative cardio ROS       Neuro/Psych PSYCHIATRIC DISORDERS Dementia negative neurological ROS     GI/Hepatic negative GI ROS, Neg liver ROS,   Endo/Other  negative endocrine ROS  Renal/GU      Musculoskeletal   Abdominal   Peds  Hematology negative hematology ROS (+)   Anesthesia Other Findings Past Medical History: No date: Breast cancer ([place])  Past Surgical History: No date: NO PAST SURGERIES  BMI    Body Mass Index:  31.18 kg/m      Reproductive/Obstetrics negative OB ROS                             Anesthesia Physical Anesthesia Plan  ASA: II  Anesthesia Plan: General ETT   Post-op Pain Management:    Induction:   PONV Risk Score and Plan: Ondansetron, Dexamethasone and Treatment may vary due to age or medical condition  Airway Management Planned:   Additional Equipment:   Intra-op Plan:   Post-operative Plan:   Informed Consent: I have reviewed the patients History and Physical, chart, labs and discussed the procedure including the risks, benefits and alternatives for the proposed anesthesia with the patient or authorized representative who has indicated his/her understanding and acceptance.   Dental Advisory Given  Plan Discussed with: Anesthesiologist, CRNA and Surgeon  Anesthesia Plan Comments: (Consent discussed with patient and daughter. Pt recently took Xarelto and is not a candidate for a spinal.  Plan GETA.)        Anesthesia Quick Evaluation

## 2018-11-26 NOTE — Consult Note (Signed)
ORTHOPAEDIC CONSULTATION  REQUESTING PHYSICIAN: Loletha Grayer, MD  Chief Complaint:   L hip pain & L shoulder pain  History of Present Illness: JONNIE KUBLY is a 82 y.o. female who had a fall earlier today.  The patient noted immediate hip pain and inability to ambulate.  The patient ambulates independently at baseline.  She lives with her son. Pain is described as sharp at its worst and a dull ache at its best.  Pain is rated a 10 out of 10 in severity.  Pain is improved with rest and immobilization.  Pain is worse with any sort of movement.  X-rays in the emergency department show a left intertrochanteric hip fracture.  Additionally, she has significant L shoulder pain. X-rays/CT scan show proximal humerus fracture.   She is on Xarelto for remote history of DVTs. Last dose was yesterday AM.  Past Medical History:  Diagnosis Date  . Breast cancer [facility])    Past Surgical History:  Procedure Laterality Date  . NO PAST SURGERIES     Social History   Socioeconomic History  . Marital status: Widowed    Spouse name: Not on file  . Number of children: Not on file  . Years of education: Not on file  . Highest education level: Not on file  Occupational History  . Not on file  Social Needs  . Financial resource strain: Not on file  . Food insecurity:    Worry: Not on file    Inability: Not on file  . Transportation needs:    Medical: Not on file    Non-medical: Not on file  Tobacco Use  . Smoking status: Current Every Day Smoker  . Smokeless tobacco: Never Used  Substance and Sexual Activity  . Alcohol use: Never    Frequency: Never  . Drug use: Never  . Sexual activity: Not on file  Lifestyle  . Physical activity:    Days per week: Not on file    Minutes per session: Not on file  . Stress: Not on file  Relationships  . Social connections:    Talks on phone: Not on file    Gets together: Not on file     Attends religious service: Not on file    Active member of club or organization: Not on file    Attends meetings of clubs or organizations: Not on file    Relationship status: Not on file  Other Topics Concern  . Not on file  Social History Narrative  . Not on file   Family History  Problem Relation Age of Onset  . CAD Mother    No Known Allergies Prior to Admission medications   Medication Sig Start Date End Date Taking? Authorizing Provider  amLODipine (NORVASC) 5 MG tablet Take 5 mg by mouth daily.   Yes [provider]  loratadine (CLARITIN) 10 MG tablet Take 10 mg by mouth daily as needed for allergies.   Yes [provider]  rivaroxaban (XARELTO) 10 MG TABS tablet Take 10 mg by mouth every evening.   Yes [provider]   Recent Labs    11/26/18 1024  WBC 17.1*  HGB 12.8  HCT 40.9  PLT 208  K 4.0  CL 108  CO2 22  BUN 17  CREATININE 1.18*  GLUCOSE 179*  CALCIUM 8.6*  INR 1.00   Dg Chest 1 View  Result Date: 11/26/2018 CLINICAL DATA:  Preoperative evaluation, a left hip fracture EXAM: CHEST  1 VIEW COMPARISON:  None. FINDINGS: Cardiac shadow is mildly enlarged. Aortic calcifications are seen. The lungs are well aerated without infiltrate or effusion. No acute bony abnormality is noted. IMPRESSION: No acute abnormality seen. Electronically Signed   By: Inez Catalina M.D.   On: 11/26/2018 10:29   Ct Head Wo Contrast  Result Date: 11/26/2018 CLINICAL DATA:  Head trauma EXAM: CT HEAD WITHOUT CONTRAST TECHNIQUE: Contiguous axial images were obtained from the base of the skull through the vertex without intravenous contrast. COMPARISON:  None. FINDINGS: Brain: No evidence of acute infarction, hemorrhage, extra-axial collection, ventriculomegaly, or mass effect. Generalized cerebral atrophy. Periventricular white matter low attenuation likely secondary to microangiopathy. Vascular: Cerebrovascular atherosclerotic calcifications are noted. Skull:  Negative for fracture or focal lesion. Sinuses/Orbits: Visualized portions of the orbits are unremarkable. Visualized portions of the paranasal sinuses and mastoid air cells are unremarkable. Other: None. IMPRESSION: No acute intracranial pathology. Electronically Signed   By: Kathreen Devoid   On: 11/26/2018 10:02   Ct Shoulder Left Wo Contrast  Result Date: 11/26/2018 CLINICAL DATA:  The patient suffered a left shoulder injury in a fall off a porch this morning. Initial encounter. EXAM: CT OF THE UPPER LEFT EXTREMITY WITHOUT CONTRAST TECHNIQUE: Multidetector CT imaging of the upper left extremity was performed according to the standard protocol. COMPARISON:  Plain films left shoulder earlier today FINDINGS: Bones/Joint/Cartilage The patient has an acute transverse fracture of the proximal diaphysis of the humerus at and just below the surgical neck. There is impaction of approximately 1 cm and anterior displacement of the shaft of the humerus of approximately 0.6 cm. The fracture does not involve the greater or lesser tuberosities. The humeral head is located. The acromioclavicular joint is intact and appears normal. No lytic or sclerotic lesion is identified. The acromion is type 1. Ligaments Suboptimally assessed by CT. Muscles and Tendons Musculature of the shoulder girdle is preserved in the rotator cuff appears intact Soft tissues Imaged lung parenchyma is clear.  Atherosclerosis is noted. IMPRESSION: Slightly impacted and anteriorly displaced fracture of the proximal left humerus without involvement of the greater or lesser tuberosities. No other acute abnormality. Atherosclerosis. Electronically Signed   By: Inge Rise M.D.   On: 11/26/2018 12:37   Dg Shoulder Left  Result Date: 11/26/2018 CLINICAL DATA:  Fall.  Left shoulder pain. EXAM: LEFT SHOULDER - 2+ VIEW COMPARISON:  None. FINDINGS: There is a left humeral neck fracture with mild displacement. No subluxation or dislocation. IMPRESSION:  Mildly displaced left humeral neck fracture. Electronically Signed   By: Rolm Baptise M.D.   On: 11/26/2018 10:27   Dg Hip Unilat W Or Wo Pelvis 2-3 Views Left  Result Date: 11/26/2018 CLINICAL DATA:  Slipped on ice. EXAM: DG HIP (WITH OR WITHOUT PELVIS) 2-3V LEFT COMPARISON:  No prior. FINDINGS: Diffuse osteopenia and degenerative change. Subtrochanteric fracture of the left proximal femur noted. Angulation deformity and displacement noted. IMPRESSION: 1. Diffuse osteopenia and degenerative change. Subtrochanteric fracture of the left proximal femur noted. Angulation deformity and displacement noted. 2.  Aortoiliac atherosclerotic vascular disease. Electronically Signed   By: Marcello Moores  Register   On: 11/26/2018 10:34   Dg Femur Min 2 Views Left  Result Date: 11/26/2018 CLINICAL DATA:  82 year old who slipped and fell on icy steps outside earlier today, injuring her LEFT hip. Initial encounter. EXAM: LEFT FEMUR 2 VIEWS COMPARISON:  None. FINDINGS: Comminuted fracture involving the proximal metaphysis at the level of the lesser trochanter. This fracture does not involve the greater trochanter, and is therefore  not technically an intertrochanteric fracture. No fractures elsewhere involving the femur. Mild osseous demineralization. Hip joint anatomically aligned with mild joint space narrowing. IMPRESSION: Acute comminuted fracture involving the proximal metaphysis of the LEFT femur at the level of the lesser trochanter (without involvement of the greater trochanter). Electronically Signed   By: Evangeline Dakin M.D.   On: 11/26/2018 12:12     Positive ROS: All other systems have been reviewed and were otherwise negative with the exception of those mentioned in the HPI and as above.  Physical Exam: BP (!) 153/53 (BP Location: Right Arm)   Pulse 70   Temp (!) 97.5 F (36.4 C) (Oral)   Resp 15   Ht 5\' 3"  (1.6 m)   Wt 79.8 kg   SpO2 100%   BMI 31.18 kg/m  General:  Alert, no acute  distress Psychiatric:  Patient is competent for consent with normal mood and affect   Cardiovascular:  No pedal edema, regular rate and rhythm Respiratory:  No wheezing, non-labored breathing GI:  Abdomen is soft and non-tender Skin:  No lesions in the area of chief complaint, no erythema Neurologic:  Sensation intact distally, CN grossly intact Lymphatic:  No axillary or cervical lymphadenopathy  Orthopedic Exam:  LLE: + DF/PF/EHL SILT grossly over foot Foot wwp +Log roll/axial load  LUE: +ain/pin/u motor SILT r/u/m/ax +rad pulse No significant bruising. TTP diffusely about shoulder. Pain with attempted Rom    X-rays L hip & L shoulder/CT scan shoulder:  As above: L subtrochanteric hip fracture and L proximal humerus fracture  Assessment/Plan: ALEXCIS BICKING is a 82 y.o. female with a L subtrochanteric hip fracture and L proximal humerus fracture  1. I discussed the various treatment options including both surgical and non-surgical management of her subtrochanteric fracture with the patient and her family. We discussed the high risk of perioperative complications due to patient's age and other co-morbidities. After discussion of risks, benefits, and alternatives to surgery, the family and patient were in agreement to proceed with surgery. The goals of surgery would be to provide adequate pain relief and allow for mobilization. Plan for surgery is L hip cephalomedullary nailing today, 11/26/2018. 2. Regarding her L proximal humerus fracture, alignment is reasonably maintained. We discussed both operative and non-operative management of the injuries, and we agreed to proceed with non-surgical management after discussion of risks and benefits to both treatment options.  3. NPO until OR 4. Hold anticoagulation in advance of OR   Leim Fabry   11/26/2018 5:17 PM

## 2018-11-26 NOTE — NC FL2 (Signed)
  [place] LEVEL OF CARE SCREENING TOOL     IDENTIFICATION  Patient Name: Cassandra Maxwell Birthdate: [birth year] Sex: female Admission Date (Current Location): 11/26/2018  [place] and [place] Number:  Engineering geologist and Address:  [facility], [address] [place], [place] [address]      Provider Number: [id number]  Attending Physician Name and Address:  Loletha Grayer, MD  Relative Name and Phone Number:       Current Level of Care: Hospital Recommended Level of Care: [place] Prior Approval Number:    Date Approved/Denied:   PASRR Number: [id number])  Discharge Plan: SNF    Current Diagnoses: Patient Active Problem List   Diagnosis Date Noted  . Hip fracture requiring operative repair, left, closed, initial encounter ([place]) 11/26/2018    Orientation RESPIRATION BLADDER Height & Weight     Self, Time, Situation, Place  Normal Continent Weight: 176 lb (79.8 kg) Height:  5\' 3"  (160 cm)  BEHAVIORAL SYMPTOMS/MOOD NEUROLOGICAL BOWEL NUTRITION STATUS      Continent Diet(Diet: NPO for surgery to be advanced. )  AMBULATORY STATUS COMMUNICATION OF NEEDS Skin   Extensive Assist Verbally Surgical wounds                       Personal Care Assistance Level of Assistance  Bathing, Feeding, Dressing Bathing Assistance: Limited assistance Feeding assistance: Independent Dressing Assistance: Limited assistance     Functional Limitations Info  Sight, Hearing, Speech Sight Info: Adequate Hearing Info: Adequate Speech Info: Adequate    SPECIAL CARE FACTORS FREQUENCY  PT (By licensed PT), OT (By licensed OT)     PT Frequency: (5) OT Frequency: (5)            Contractures      Additional Factors Info  Code Status, Allergies Code Status Info: (Full Code. ) Allergies Info: (No Known Allergies. )           Current Medications (11/26/2018):  This is the current hospital active medication  list Current Facility-Administered Medications  Medication Dose Route Frequency Provider Last Rate Last Dose  . acetaminophen (TYLENOL) tablet 650 mg  650 mg Oral Q6H PRN Loletha Grayer, MD       Or  . acetaminophen (TYLENOL) suppository 650 mg  650 mg Rectal Q6H PRN Wieting, Richard, MD      . amLODipine (NORVASC) tablet 5 mg  5 mg Oral Daily Wieting, Richard, MD      . Derrill Memo ON 11/27/2018] loratadine (CLARITIN) tablet 10 mg  10 mg Oral Daily PRN Wieting, Richard, MD      . morphine 2 MG/ML injection 2 mg  2 mg Intravenous Q3H PRN Loletha Grayer, MD   2 mg at 11/26/18 1249  . ondansetron (ZOFRAN) tablet 4 mg  4 mg Oral Q6H PRN Loletha Grayer, MD       Or  . ondansetron (ZOFRAN) injection 4 mg  4 mg Intravenous Q6H PRN Wieting, Richard, MD      . oxyCODONE (Oxy IR/ROXICODONE) immediate release tablet 5 mg  5 mg Oral Q4H PRN Loletha Grayer, MD         Discharge Medications: Please see discharge summary for a list of discharge medications.  Relevant Imaging Results:  Relevant Lab Results:   Additional Information (SSN: [id number])  Dory Verdun, [place]onia Beets, LCSW

## 2018-11-26 NOTE — ED Notes (Signed)
ED TO INPATIENT HANDOFF REPORT  Name/Age/Gender Cassandra Maxwell 82 y.o. female  Code Status    Code Status Orders  (From admission, onward)         Start     Ordered   11/26/18 1136  Full code  Continuous     11/26/18 1136        Code Status History    This patient has a current code status but no historical code status.      Home/SNF/Other Home  Chief Complaint fall  Level of Care/Admitting Diagnosis ED Disposition    ED Disposition Condition [facility] Area: [place] [100120]  Level of Care: Med-Surg [16]  Diagnosis: Hip fracture requiring operative repair, left, closed, initial encounter [facility]) [[id number]]  Admitting Physician: Loletha Grayer [[id number]]  Attending Physician: Loletha Grayer [phone]  Estimated length of stay: past midnight tomorrow  Certification:: I certify this patient will need inpatient services for at least 2 midnights  PT Class (Do Not Modify): Inpatient [101]  PT Acc Code (Do Not Modify): Private [1]       Medical History Past Medical History:  Diagnosis Date  . Breast cancer ([place])     Allergies No Known Allergies  IV Location/Drains/Wounds Patient Lines/Drains/Airways Status   Active Line/Drains/Airways    Name:   Placement date:   Placement time:   Site:   Days:   Peripheral IV 11/26/18 Right Hand   11/26/18    -    Hand   less than 1          Labs/Imaging Results for orders placed or performed during the hospital encounter of 11/26/18 (from the past 48 hour(s))  CBC with Differential     Status: Abnormal   Collection Time: 11/26/18 10:24 AM  Result Value Ref Range   WBC 17.1 (H) 4.0 - 10.5 K/uL   RBC 4.48 3.87 - 5.11 MIL/uL   Hemoglobin 12.8 12.0 - 15.0 g/dL   HCT 40.9 36.0 - 46.0 %   MCV 91.3 80.0 - 100.0 fL   MCH 28.6 26.0 - 34.0 pg   MCHC 31.3 30.0 - 36.0 g/dL   RDW 13.2 11.5 - 15.5 %   Platelets 208 150 - 400 K/uL   nRBC 0.0 0.0 - 0.2 %   Neutrophils Relative % 84 %    Neutro Abs 14.4 (H) 1.7 - 7.7 K/uL   Lymphocytes Relative 8 %   Lymphs Abs 1.4 0.7 - 4.0 K/uL   Monocytes Relative 5 %   Monocytes Absolute 0.9 0.1 - 1.0 K/uL   Eosinophils Relative 2 %   Eosinophils Absolute 0.4 0.0 - 0.5 K/uL   Basophils Relative 0 %   Basophils Absolute 0.1 0.0 - 0.1 K/uL   Immature Granulocytes 1 %   Abs Immature Granulocytes 0.11 (H) 0.00 - 0.07 K/uL    Comment: Performed at [facility], [address] [place], [place] [address]  Basic metabolic panel     Status: Abnormal   Collection Time: 11/26/18 10:24 AM  Result Value Ref Range   Sodium 139 135 - 145 mmol/L   Potassium 4.0 3.5 - 5.1 mmol/L   Chloride 108 98 - 111 mmol/L   CO2 22 22 - 32 mmol/L   Glucose, Bld 179 (H) 70 - 99 mg/dL   BUN 17 8 - 23 mg/dL   Creatinine, Ser 1.18 (H) 0.44 - 1.00 mg/dL   Calcium 8.6 (L) 8.9 - 10.3 mg/dL   GFR  calc non Af Amer 44 (L) >60 mL/min   GFR calc Af Amer 50 (L) >60 mL/min   Anion gap 9 5 - 15    Comment: Performed at [facility], [address] [place], [place] [address]  Protime-INR     Status: None   Collection Time: 11/26/18 10:24 AM  Result Value Ref Range   Prothrombin Time 13.1 11.4 - 15.2 seconds   INR 1.00     Comment: Performed at [facility], [address], [place], [place] [address]   Dg Chest 1 View  Result Date: 11/26/2018 CLINICAL DATA:  Preoperative evaluation, a left hip fracture EXAM: CHEST  1 VIEW COMPARISON:  None. FINDINGS: Cardiac shadow is mildly enlarged. Aortic calcifications are seen. The lungs are well aerated without infiltrate or effusion. No acute bony abnormality is noted. IMPRESSION: No acute abnormality seen. Electronically Signed   By: Inez Catalina M.D.   On: 11/26/2018 10:29   Ct Head Wo Contrast  Result Date: 11/26/2018 CLINICAL DATA:  Head trauma EXAM: CT HEAD WITHOUT CONTRAST TECHNIQUE: Contiguous axial images were obtained from the base of the skull through the vertex without intravenous  contrast. COMPARISON:  None. FINDINGS: Brain: No evidence of acute infarction, hemorrhage, extra-axial collection, ventriculomegaly, or mass effect. Generalized cerebral atrophy. Periventricular white matter low attenuation likely secondary to microangiopathy. Vascular: Cerebrovascular atherosclerotic calcifications are noted. Skull: Negative for fracture or focal lesion. Sinuses/Orbits: Visualized portions of the orbits are unremarkable. Visualized portions of the paranasal sinuses and mastoid air cells are unremarkable. Other: None. IMPRESSION: No acute intracranial pathology. Electronically Signed   By: Kathreen Devoid   On: 11/26/2018 10:02   Dg Shoulder Left  Result Date: 11/26/2018 CLINICAL DATA:  Fall.  Left shoulder pain. EXAM: LEFT SHOULDER - 2+ VIEW COMPARISON:  None. FINDINGS: There is a left humeral neck fracture with mild displacement. No subluxation or dislocation. IMPRESSION: Mildly displaced left humeral neck fracture. Electronically Signed   By: Rolm Baptise M.D.   On: 11/26/2018 10:27   Dg Hip Unilat W Or Wo Pelvis 2-3 Views Left  Result Date: 11/26/2018 CLINICAL DATA:  Slipped on ice. EXAM: DG HIP (WITH OR WITHOUT PELVIS) 2-3V LEFT COMPARISON:  No prior. FINDINGS: Diffuse osteopenia and degenerative change. Subtrochanteric fracture of the left proximal femur noted. Angulation deformity and displacement noted. IMPRESSION: 1. Diffuse osteopenia and degenerative change. Subtrochanteric fracture of the left proximal femur noted. Angulation deformity and displacement noted. 2.  Aortoiliac atherosclerotic vascular disease. Electronically Signed   By: Marcello Moores  Register   On: 11/26/2018 10:34    Pending Labs Unresulted Labs (From admission, onward)    Start     Ordered   11/27/18 9924  Basic metabolic panel  Tomorrow morning,   STAT     11/26/18 1136   11/27/18 0500  CBC  Tomorrow morning,   STAT     11/26/18 1136          Vitals/Pain Today's Vitals   11/26/18 0936 11/26/18 1100  11/26/18 1130 11/26/18 1134  BP:  124/83 119/81   Pulse:  68 67   Resp:  (!) 24 15   Temp:      TempSrc:      SpO2:  100% 100%   Weight: 81.6 kg     Height: 5\' 4"  (1.626 m)     PainSc: 6    7     Isolation Precautions No active isolations  Medications Medications  acetaminophen (TYLENOL) tablet 650 mg (has no administration  in time range)    Or  acetaminophen (TYLENOL) suppository 650 mg (has no administration in time range)  oxyCODONE (Oxy IR/ROXICODONE) immediate release tablet 5 mg (has no administration in time range)  ondansetron (ZOFRAN) tablet 4 mg (has no administration in time range)    Or  ondansetron (ZOFRAN) injection 4 mg (has no administration in time range)  morphine 2 MG/ML injection 2 mg (has no administration in time range)  amLODipine (NORVASC) tablet 5 mg (has no administration in time range)  loratadine (CLARITIN) tablet 10 mg (has no administration in time range)  morphine 4 MG/ML injection 4 mg (4 mg Intravenous Given 11/26/18 1022)    Mobility non-ambulatory

## 2018-11-26 NOTE — Care Management Note (Signed)
Case Management Note  Patient Details  Name: Cassandra Maxwell MRN: [id number] Date of Birth: [birth year]  Subjective/Objective:   Patient admitted today and is likely to the OR this evening to repair fractured hip. Patient lives with her son [name] and has five other children who plan to stay with her 24/7 at discharge. At baseline patient is completely independent and uses no DME. She will need a rolling walker at discharge as well as a bedside commode. Will obtain DME from [place]. CMS Medicare.gov Compare Post Acute Care list reviewed with patient and multiple children. They are familiar with [facility] and prefer to use them for both DME and home health. Referral placed with Corene Cornea from [facility]. RNCM team will follow with patient and family for transition of care planning following scheduled surgery.                 Action/Plan:   Expected Discharge Date:                  Expected Discharge Plan:  [place]  In-House Referral:     Discharge planning Services  CM Consult  Post Acute Care Choice:  Durable Medical Equipment, Home Health Choice offered to:  Patient, Spouse, Adult Children  DME Arranged:  Walker rolling, Bedside commode DME Agency:  [place]:  PT [place]:  [place]  Status of Service:  In process, will continue to follow  If discussed at Long Length of Stay Meetings, dates discussed:    Additional Comments:  [name], RN 11/26/2018, 3:07 PM

## 2018-11-26 NOTE — Progress Notes (Addendum)
Patient ID: Cassandra Maxwell, female   DOB: Mar 21, 1936, 82 y.o.   MRN: [id number]  ACP note  Patient and family at bedside  Diagnosis: Left proximal femur fracture, left humerus fracture, hypertension, leukocytosis  CODE STATUS discussed.  Patient wishes to be a full code  Plan.  Patient will go to the operating room for weightbearing bone.  No contraindications to surgery at this time.  Hold Xarelto for now.  Time spent on ACP discussion 17 minutes Dr. Loletha Grayer

## 2018-11-26 NOTE — H&P (Addendum)
[facility] [place] NAME: Cassandra Maxwell    MR#:  [id number]  DATE OF BIRTH:  29-Jul-1936  DATE OF ADMISSION:  11/26/2018  PRIMARY CARE PHYSICIAN: Petra Kuba, MD   REQUESTING/REFERRING PHYSICIAN: Dr Charlotte Crumb  CHIEF COMPLAINT:   Chief Complaint  Patient presents with  . Fall    HISTORY OF PRESENT ILLNESS:  Cassandra Maxwell  is a 82 y.o. female stated that she went out on the steps this morning and there was ice and she slipped and fell.  No loss of consciousness and did not hit her head.  She is having pain in the left shoulder and left hip.  Pain described 8 out of 10 in intensity.  In the ER found to have a proximal femur fracture and humeral neck fracture on the left side.  Hospitalist services were contacted for further evaluation.  No complaints of chest pain or shortness of breath.  She is unclear why she is taking blood thinner for years.  Confirm with the pharmacy tech that she actually takes Xarelto and not warfarin.  She is on the lower dose.  PAST MEDICAL HISTORY:   Past Medical History:  Diagnosis Date  . Breast cancer ([place])     PAST SURGICAL HISTORY:   Past Surgical History:  Procedure Laterality Date  . NO PAST SURGERIES      SOCIAL HISTORY:   Social History   Tobacco Use  . Smoking status: Current Every Day Smoker  . Smokeless tobacco: Never Used  Substance Use Topics  . Alcohol use: Never    Frequency: Never    FAMILY HISTORY:   Family History  Problem Relation Age of Onset  . CAD Mother     DRUG ALLERGIES:  No Known Allergies  REVIEW OF SYSTEMS:  CONSTITUTIONAL: No fever, fatigue or weakness.  EYES: No blurred or double vision.  EARS, NOSE, AND THROAT: No tinnitus or ear pain. No sore throat.  RESPIRATORY: No cough, shortness of breath, wheezing or hemoptysis.  CARDIOVASCULAR: No chest pain, orthopnea, edema.  GASTROINTESTINAL: No nausea, vomiting, diarrhea or abdominal pain. No  blood in bowel movements GENITOURINARY: No dysuria, hematuria.  ENDOCRINE: No polyuria, nocturia,  HEMATOLOGY: No anemia, easy bruising or bleeding SKIN: No rash or lesion. MUSCULOSKELETAL: Left shoulder pain and left hip pain NEUROLOGIC: No tingling, numbness, weakness.  PSYCHIATRY: No anxiety or depression.   MEDICATIONS AT HOME:   Prior to Admission medications   Medication Sig Start Date End Date Taking? Authorizing Provider  amLODipine (NORVASC) 5 MG tablet Take 5 mg by mouth daily.   Yes [provider]  loratadine (CLARITIN) 10 MG tablet Take 10 mg by mouth daily as needed for allergies.   Yes [provider]  rivaroxaban (XARELTO) 10 MG TABS tablet Take 10 mg by mouth every evening.   Yes [provider]     VITAL SIGNS:  Blood pressure (!) 160/71, temperature (!) 96.4 F (35.8 C), temperature source Oral, resp. rate 19, height 5\' 4"  (1.626 m), weight 81.6 kg, SpO2 98 %.  PHYSICAL EXAMINATION:  GENERAL:  82 y.o.-year-old patient lying in the bed with no acute distress.  EYES: Pupils equal, round, reactive to light and accommodation. No scleral icterus. Extraocular muscles intact.  HEENT: Head atraumatic, normocephalic. Oropharynx and nasopharynx clear.  NECK:  Supple, no jugular venous distention. No thyroid enlargement, no tenderness.  LUNGS: Normal breath sounds bilaterally, no wheezing, rales,rhonchi or crepitation. No use of accessory muscles of  respiration.  CARDIOVASCULAR: S1, S2 normal. No murmurs, rubs, or gallops.  ABDOMEN: Soft, nontender, nondistended. Bowel sounds present. No organomegaly or mass.  EXTREMITIES: Left leg shortened and externally rotated NEUROLOGIC: Cranial nerves II through XII are intact. Muscle strength 5/5 in all extremities. Sensation intact. Gait not checked.  PSYCHIATRIC: The patient is alert and oriented x 3.  SKIN: No rash, lesion, or ulcer.   LABORATORY PANEL:   CBC Recent Labs  Lab 11/26/18 1024  WBC  17.1*  HGB 12.8  HCT 40.9  PLT 208   ------------------------------------------------------------------------------------------------------------------  Chemistries  Recent Labs  Lab 11/26/18 1024  NA 139  K 4.0  CL 108  CO2 22  GLUCOSE 179*  BUN 17  CREATININE 1.18*  CALCIUM 8.6*   ------------------------------------------------------------------------------------------------------------------    RADIOLOGY:  Dg Chest 1 View  Result Date: 11/26/2018 CLINICAL DATA:  Preoperative evaluation, a left hip fracture EXAM: CHEST  1 VIEW COMPARISON:  None. FINDINGS: Cardiac shadow is mildly enlarged. Aortic calcifications are seen. The lungs are well aerated without infiltrate or effusion. No acute bony abnormality is noted. IMPRESSION: No acute abnormality seen. Electronically Signed   By: Inez Catalina M.D.   On: 11/26/2018 10:29   Ct Head Wo Contrast  Result Date: 11/26/2018 CLINICAL DATA:  Head trauma EXAM: CT HEAD WITHOUT CONTRAST TECHNIQUE: Contiguous axial images were obtained from the base of the skull through the vertex without intravenous contrast. COMPARISON:  None. FINDINGS: Brain: No evidence of acute infarction, hemorrhage, extra-axial collection, ventriculomegaly, or mass effect. Generalized cerebral atrophy. Periventricular white matter low attenuation likely secondary to microangiopathy. Vascular: Cerebrovascular atherosclerotic calcifications are noted. Skull: Negative for fracture or focal lesion. Sinuses/Orbits: Visualized portions of the orbits are unremarkable. Visualized portions of the paranasal sinuses and mastoid air cells are unremarkable. Other: None. IMPRESSION: No acute intracranial pathology. Electronically Signed   By: Kathreen Devoid   On: 11/26/2018 10:02   Dg Shoulder Left  Result Date: 11/26/2018 CLINICAL DATA:  Fall.  Left shoulder pain. EXAM: LEFT SHOULDER - 2+ VIEW COMPARISON:  None. FINDINGS: There is a left humeral neck fracture with mild  displacement. No subluxation or dislocation. IMPRESSION: Mildly displaced left humeral neck fracture. Electronically Signed   By: Rolm Baptise M.D.   On: 11/26/2018 10:27   Dg Hip Unilat W Or Wo Pelvis 2-3 Views Left  Result Date: 11/26/2018 CLINICAL DATA:  Slipped on ice. EXAM: DG HIP (WITH OR WITHOUT PELVIS) 2-3V LEFT COMPARISON:  No prior. FINDINGS: Diffuse osteopenia and degenerative change. Subtrochanteric fracture of the left proximal femur noted. Angulation deformity and displacement noted. IMPRESSION: 1. Diffuse osteopenia and degenerative change. Subtrochanteric fracture of the left proximal femur noted. Angulation deformity and displacement noted. 2.  Aortoiliac atherosclerotic vascular disease. Electronically Signed   By: Marcello Moores  Register   On: 11/26/2018 10:34    EKG:   Ordered  IMPRESSION AND PLAN:   1.  Preoperative evaluation for left proximal femur fracture, closed initial evaluation.  Requiring operative repair.  Hold low-dose Xarelto.  No contraindications to surgery. 2.  Hypertension on Norvasc.  Blood pressure high secondary to pain 3.  Leukocytosis could be elevated with fall.  Send off a urinalysis 4.  Impaired fasting glucose check a hemoglobin A1c 5.  Either acute kidney injury versus chronic kidney disease stage III.  I will give IV fluids and monitor creatinine in the morning. 6.  Breast cancer history seen in care everywhere. 7.  Patient on Xarelto.  She cannot tell me the reason why  she is on it.  She is on the lower dose of 10 mg which is usually peripheral vascular disease dosing versus preventing recurrent DVT dosing.  Hold that at this time. 8.  Left humerus fracture.  CT scan ordered by ER physician..  Pain control.  Orthopedic follow-up.  All the records are reviewed and case discussed with ED provider. Management plans discussed with the patient, family and they are in agreement.  CODE STATUS: Full code  TOTAL TIME TAKING CARE OF THIS PATIENT: 50  minutes.    Loletha Grayer M.D on 11/26/2018 at 11:40 AM  Between 7am to 6pm - Pager - [phone]  After 6pm call admission pager [phone]  [facility] Office  [phone]  CC: Primary care physician; Petra Kuba, MD

## 2018-11-26 NOTE — Clinical Social Work Note (Signed)
Clinical Social Work Assessment  Patient Details  Name: Cassandra Maxwell MRN: [id number] Date of Birth: 1936-09-27  Date of referral:  11/26/18               Reason for consult:  Facility Placement                Permission sought to share information with:    Permission granted to share information::     Name::        Agency::     Relationship::     Contact Information:     Housing/Transportation Living arrangements for the past 2 months:  Single Family Home Source of Information:  Patient, Adult Children Patient Interpreter Needed:  None Criminal Activity/Legal Involvement Pertinent to Current Situation/Hospitalization:  No - Comment as needed Significant Relationships:  Adult Children, Other(Comment)(Grandchildren ) Lives with:  Adult Children Do you feel safe going back to the place where you live?  Yes Need for family participation in patient care:  Yes (Comment)  Care giving concerns:  Patient lives in [place] with her son Cassandra Maxwell.    Social Worker assessment / plan:  Holiday representative (CSW) received SNF consult. Patient has a femur fracture. Surgery and PT are pending. CSW met with patient prior to surgery today and her daughter/ HPOA Cassandra Maxwell [phone], daughter Cassandra Maxwell, a third daughter and 2 adult grandchildren were at bedside. Patient was alert and oriented X4 and was laying in the bed. CSW introduced self and explained role of CSW department. Per patient she lives in [place] and her son Cassandra Maxwell lives with her. Per patient she has 3 adult daughters and 3 adults sons that all live near by and provide support. Per patient her daughter Cassandra Maxwell is her HPOA. Per patient the ortho surgeon told her she would have surgery today around 5 pm. CSW explained that after surgery PT will evaluate patient and make a recommendation of home health or SNF. Patient prefers to go home and stated she has a lot of support. Patient's daughters reported that patient will have 24/7 care at home. Patient  reported that prior to this fall she was walking independently without an assistive device. CSW explained that [facility] will have to approve SNF if that is needed. Per patient she has a history of breast cancer and is not currently doing any chemo or radiation treatment. Patient prefers to D/C home. RN and RN case manager aware of above. CSW will continue to follow and assist as needed.   Employment status:  Retired Nurse, adult PT Recommendations:  Not assessed at this time Information / Referral to community resources:  Skilled Nursing Facility(Home Health VS. SNF. )  Patient/Family's Response to care:  Patient prefers to D/C home.   Patient/Family's Understanding of and Emotional Response to Diagnosis, Current Treatment, and Prognosis:  Patient and her family were very pleasant and thanked CSW for assistance.   Emotional Assessment Appearance:  Appears stated age Attitude/Demeanor/Rapport:    Affect (typically observed):  Accepting, Adaptable, Pleasant Orientation:  Oriented to Self, Oriented to Place, Oriented to  Time, Oriented to Situation Alcohol / Substance use:  Not Applicable Psych involvement (Current and /or in the community):  No (Comment)  Discharge Needs  Concerns to be addressed:  Discharge Planning Concerns Readmission within the last 30 days:  No Current discharge risk:  Dependent with Mobility Barriers to Discharge:  Continued Medical Work up   [employer], Cassandra Beets, LCSW 11/26/2018, 2:38 PM

## 2018-11-26 NOTE — Anesthesia Procedure Notes (Signed)
Procedure Name: Intubation Date/Time: 11/26/2018 6:51 PM Performed by: Dionne Bucy, CRNA Pre-anesthesia Checklist: Patient identified, Patient being monitored, Timeout performed, Emergency Drugs available and Suction available Patient Re-evaluated:Patient Re-evaluated prior to induction Oxygen Delivery Method: Circle system utilized Preoxygenation: Pre-oxygenation with 100% oxygen Induction Type: IV induction Ventilation: Mask ventilation without difficulty Laryngoscope Size: Mac and 3 Grade View: Grade I Tube type: Oral Tube size: 7.0 mm Number of attempts: 1 Airway Equipment and Method: Stylet Placement Confirmation: ETT inserted through vocal cords under direct vision,  positive ETCO2 and breath sounds checked- equal and bilateral Secured at: 21 cm Tube secured with: Tape Dental Injury: Teeth and Oropharynx as per pre-operative assessment

## 2018-11-26 NOTE — H&P (Signed)
H&P reviewed. No significant changes noted.  

## 2018-11-27 ENCOUNTER — Encounter: Payer: Self-pay | Admitting: Orthopedic Surgery

## 2018-11-27 LAB — BASIC METABOLIC PANEL
Anion gap: 10 (ref 5–15)
BUN: 23 mg/dL (ref 8–23)
CO2: 20 mmol/L — ABNORMAL LOW (ref 22–32)
CREATININE: 1.35 mg/dL — AB (ref 0.44–1.00)
Calcium: 7.9 mg/dL — ABNORMAL LOW (ref 8.9–10.3)
Chloride: 108 mmol/L (ref 98–111)
GFR calc Af Amer: 43 mL/min — ABNORMAL LOW (ref >60)
GFR calc non Af Amer: 37 mL/min — ABNORMAL LOW (ref >60)
Glucose, Bld: 158 mg/dL — ABNORMAL HIGH (ref 70–99)
Potassium: 4 mmol/L (ref 3.5–5.1)
Sodium: 138 mmol/L (ref 135–145)

## 2018-11-27 LAB — CBC
HCT: 32.9 % — ABNORMAL LOW (ref 36.0–46.0)
Hemoglobin: 10.3 g/dL — ABNORMAL LOW (ref 12.0–15.0)
MCH: 28.5 pg (ref 26.0–34.0)
MCHC: 31.3 g/dL (ref 30.0–36.0)
MCV: 90.9 fL (ref 80.0–100.0)
Platelets: 163 10*3/uL (ref 150–400)
RBC: 3.62 MIL/uL — ABNORMAL LOW (ref 3.87–5.11)
RDW: 13.3 % (ref 11.5–15.5)
WBC: 9.2 10*3/uL (ref 4.0–10.5)
nRBC: 0 % (ref 0.0–0.2)

## 2018-11-27 MED ORDER — TRAMADOL HCL 50 MG PO TABS: 50.0000 mg | ORAL_TABLET | Freq: Four times a day (QID) | ORAL | 0 refills | Status: DC | PRN

## 2018-11-27 MED ORDER — OXYCODONE HCL 5 MG PO TABS: 5.0000 mg | ORAL_TABLET | Freq: Four times a day (QID) | ORAL | 0 refills | Status: DC | PRN

## 2018-11-27 NOTE — Discharge Instructions (Signed)
INSTRUCTIONS AFTER Surgery  o Remove items at home which could result in a fall. This includes throw rugs or furniture in walking pathways o ICE to the affected joint every three hours while awake for 30 minutes at a time, for at least the first 3-5 days, and then as needed for pain and swelling.  Continue to use ice for pain and swelling. You may notice swelling that will progress down to the foot and ankle.  This is normal after surgery.  Elevate your leg when you are not up walking on it.   o Continue to use the breathing machine you got in the hospital (incentive spirometer) which will help keep your temperature down.  It is common for your temperature to cycle up and down following surgery, especially at night when you are not up moving around and exerting yourself.  The breathing machine keeps your lungs expanded and your temperature down.   DIET:  As you were doing prior to hospitalization, we recommend a well-balanced diet.  DRESSING / WOUND CARE / SHOWERING  Dressings to be changed as needed.  Presently waterproof bandages.  Able to bathe.  Staples to be removed in 2 weeks at [facility].  ACTIVITY  o Increase activity slowly as tolerated, but follow the weight bearing instructions below.   o No driving for 6 weeks or until further direction given by your physician.  You cannot drive while taking narcotics.  o No lifting or carrying greater than 10 lbs. until further directed by your surgeon. o Avoid periods of inactivity such as sitting longer than an hour when not asleep. This helps prevent blood clots.  o You may return to work once you are authorized by your doctor.     WEIGHT BEARING  Weightbearing as tolerated   EXERCISES Gait training and range of motion with strengthening.  Occupational Therapy.  ADLs.  CONSTIPATION  Constipation is defined medically as fewer than three stools per week and severe constipation as less than one stool per week.  Even if you have a  regular bowel pattern at home, your normal regimen is likely to be disrupted due to multiple reasons following surgery.  Combination of anesthesia, postoperative narcotics, change in appetite and fluid intake all can affect your bowels.   YOU MUST use at least one of the following options; they are listed in order of increasing strength to get the job done.  They are all available over the counter, and you may need to use some, POSSIBLY even all of these options:    Drink plenty of fluids (prune juice may be helpful) and high fiber foods Colace 100 mg by mouth twice a day  Senokot for constipation as directed and as needed Dulcolax (bisacodyl), take with full glass of water  Miralax (polyethylene glycol) once or twice a day as needed.  If you have tried all these things and are unable to have a bowel movement in the first 3-4 days after surgery call either your surgeon or your primary doctor.    If you experience loose stools or diarrhea, hold the medications until you stool forms back up.  If your symptoms do not get better within 1 week or if they get worse, check with your doctor.  If you experience "the worst abdominal pain ever" or develop nausea or vomiting, please contact the office immediately for further recommendations for treatment.   ITCHING:  If you experience itching with your medications, try taking only a single pain pill, or  even half a pain pill at a time.  You can also use Benadryl over the counter for itching or also to help with sleep.   TED HOSE STOCKINGS:  Use stockings on both legs until for at least 2 weeks or as directed by physician office. They may be removed at night for sleeping.  MEDICATIONS:  See your medication summary on the After Visit Summary that nursing will review with you.  You may have some home medications which will be placed on hold until you complete the course of blood thinner medication.  It is important for you to complete the blood thinner  medication as prescribed.  PRECAUTIONS:  If you experience chest pain or shortness of breath - call 911 immediately for transfer to the hospital emergency department.   If you develop a fever greater that 101 F, purulent drainage from wound, increased redness or drainage from wound, foul odor from the wound/dressing, or calf pain - CONTACT YOUR SURGEON.                                                   FOLLOW-UP APPOINTMENTS:  If you do not already have a post-op appointment, please call the office for an appointment to be seen by your surgeon.  Guidelines for how soon to be seen are listed in your After Visit Summary, but are typically between 1-4 weeks after surgery.  OTHER INSTRUCTIONS:     MAKE SURE YOU:   Understand these instructions.   Get help right away if you are not doing well or get worse.    Thank you for letting us be a part of your medical care team.  It is a privilege we respect greatly.  We hope these instructions will help you stay on track for a fast and full recovery!

## 2018-11-27 NOTE — Plan of Care (Signed)

## 2018-11-27 NOTE — Care Management (Signed)
Cassandra Maxwell with [facility] notified that patient plans to go to [facility] at discharge.

## 2018-11-27 NOTE — Progress Notes (Signed)
  Subjective: 1 Day Post-Op Procedure(s) (LRB): INTRAMEDULLARY (IM) NAIL INTERTROCHANTRIC (Left) Patient reports pain as mild.   Patient seen in rounds with Dr. Posey Pronto. Patient is well, and has had no acute complaints or problems Plan is to go Rehab after hospital stay. Negative for chest pain and shortness of breath Fever: no Gastrointestinal: Negative for nausea and vomiting  Objective: Vital signs in last 24 hours: Temp:  [97.5 F (36.4 C)-98.6 F (37 C)] 98.6 F (37 C) (12/13 0752) Pulse Rate:  [72-91] 72 (12/13 0752) Resp:  [15-27] 22 (12/13 0308) BP: (101-128)/(54-63) 128/54 (12/13 0752) SpO2:  [91 %-100 %] 97 % (12/13 0752)  Intake/Output from previous day:  Intake/Output Summary (Last 24 hours) at 11/27/2018 1445 Last data filed at 11/27/2018 9977 Gross per 24 hour  Intake 1734.74 ml  Output 730 ml  Net 1004.74 ml    Intake/Output this shift: No intake/output data recorded.  Labs: Recent Labs    11/26/18 1024 11/27/18 0431  HGB 12.8 10.3*   Recent Labs    11/26/18 1024 11/27/18 0431  WBC 17.1* 9.2  RBC 4.48 3.62*  HCT 40.9 32.9*  PLT 208 163   Recent Labs    11/26/18 1024 11/27/18 0431  NA 139 138  K 4.0 4.0  CL 108 108  CO2 22 20*  BUN 17 23  CREATININE 1.18* 1.35*  GLUCOSE 179* 158*  CALCIUM 8.6* 7.9*   Recent Labs    11/26/18 1024  INR 1.00     EXAM General - Patient is Oriented Extremity - Sensation intact distally Dorsiflexion/Plantar flexion intact Compartment soft Dressing/Incision - clean, dry, no drainage Motor Function - intact, moving foot and toes well on exam.   Past Medical History:  Diagnosis Date  . Breast cancer (HCC)     Assessment/Plan: 1 Day Post-Op Procedure(s) (LRB): INTRAMEDULLARY (IM) NAIL INTERTROCHANTRIC (Left) Active Problems:   Hip fracture requiring operative repair, left, closed, initial encounter ([place])  Estimated body mass index is 31.18 kg/m as calculated from the following:   Height as  of this encounter: 5\' 3"  (1.6 m).   Weight as of this encounter: 79.8 kg. Advance diet Up with therapy D/C IV fluids Discharge to SNF when cleared by medicine  DVT Prophylaxis - Xarelto, Foot Pumps and TED hose Weight-Bearing as tolerated to left leg  Reche Dixon, PA-C Orthopaedic Surgery 11/27/2018, 2:45 PM

## 2018-11-27 NOTE — Clinical Social Work Placement (Signed)
   CLINICAL SOCIAL WORK PLACEMENT  NOTE  Date:  11/27/2018  Patient Details  Name: Cassandra Maxwell MRN: [id number] Date of Birth: [birth year]  Clinical Social Work is seeking post-discharge placement for this patient at the [place] level of care (*CSW will initial, date and re-position this form in  chart as items are completed):  Yes   Patient/family provided with [facility] Work Department's list of facilities offering this level of care within the geographic area requested by the patient (or if unable, by the patient's family).  Yes   Patient/family informed of their freedom to choose among providers that offer the needed level of care, that participate in Medicare, Medicaid or managed care program needed by the patient, have an available bed and are willing to accept the patient.  Yes   Patient/family informed of [facility]'s ownership interest in [facility] and [facility], as well as of the fact that they are under no obligation to receive care at these facilities.  PASRR submitted to EDS on 11/26/18     PASRR number received on 11/26/18     Existing PASRR number confirmed on       FL2 transmitted to all facilities in geographic area requested by pt/family on 11/27/18     FL2 transmitted to all facilities within larger geographic area on       Patient informed that his/her managed care company has contracts with or will negotiate with certain facilities, including the following:        Yes   Patient/family informed of bed offers received.  [facility] chooses bed at ([facility]     Physician recommends and patient chooses bed at      Patient to be transferred to   on  .  Patient to be transferred to facility by       Patient family notified on   of transfer.  Name of family member notified:        PHYSICIAN       Additional Comment:    _______________________________________________ Whitlee Sluder, Veronia Beets, LCSW 11/27/2018, 12:13  PM

## 2018-11-27 NOTE — Progress Notes (Signed)
PT is recommending SNF. Clinical Social Worker (CSW) met with patient and her daughter Mona and made them aware of above. Patient is agreeable to SNF search in [place]. CSW presented bed offers and discussed quality measures of the facilities. [facility] chose [facility]. CSW stated [facility] [employer] SNF authorization through [employer]. [facility] liaison is aware of above.    , LCSW [phone] 

## 2018-11-27 NOTE — Progress Notes (Signed)
Physical Therapy Treatment Patient Details Name: Cassandra Maxwell MRN: [id number] DOB: 1936-02-18 Today's Date: 11/27/2018    History of Present Illness Pt. is an 82 y.o. female who was admitted to [facility] for ORIF repair of a Left Hip Fracture, and Left Proximal Humerus Fracture (nonsurgical) sustained during a  following a fall on the stairs when feeding her dog.    PT Comments    Pt showed good effort with all aspects of PT but is still having a lot of pain and very guarded with all tasks.  She was able to increase gait distance minimally and was eager to do more but simply could not tolerate it.  She knows she will eventually get back to ambulating and being independent but she is clearly very frustrated with her current situation as she has a lot of pain, needs max assist for mobility and is very limited with standing/walking.   Follow Up Recommendations  SNF     Equipment Recommendations  Rolling walker with 5" wheels    Recommendations for Other Services       Precautions / Restrictions Precautions Precautions: Fall    Mobility  Bed Mobility Overal bed mobility: Needs Assistance Bed Mobility: Sit to Supine       Sit to supine: Mod assist;Max assist   General bed mobility comments: Pt able to help a little with getting R LE back into bed, but needed heavy assist to get to supine  Transfers Overall transfer level: Needs assistance Equipment used: Hemi-walker Transfers: Sit to/from Stand Sit to Stand: Mod assist         General transfer comment: 2 X sit to standing this session.  Pt showed great effort but could not get weight forward enough to rise w/o assist.  considerable pain and effort with the transfer.  Ambulation/Gait Ambulation/Gait assistance: Min guard;Min assist Gait Distance (Feet): 7 Feet Assistive device: Hemi-walker       General Gait Details: Pt continues to show good effort but have significant limitations due to pain   Stairs              Wheelchair Mobility    Modified Rankin (Stroke Patients Only)       Balance Overall balance assessment: Needs assistance   Sitting balance-Leahy Scale: Good       Standing balance-Leahy Scale: Poor Standing balance comment: highly reliant on R UE and very guarded with WBing on L LE`                            Cognition Arousal/Alertness: Awake/alert Behavior During Therapy: WFL for tasks assessed/performed Overall Cognitive Status: Within Functional Limits for tasks assessed                                        Exercises General Exercises - Lower Extremity Ankle Circles/Pumps: AROM;10 reps Quad Sets: Strengthening;10 reps Gluteal Sets: AROM;10 reps Short Arc Quad: AROM;10 reps Heel Slides: AAROM;5 reps(pt in a lot of pain with this exercise) Hip ABduction/ADduction: AROM;10 reps    General Comments        Pertinent Vitals/Pain Pain Assessment: No/denies pain Pain Score: 5  Pain Location: L shoulder > L hip, increased pain in both with any movement    Home Living Family/patient expects to be discharged to:: Skilled nursing facility Living Arrangements: Children  Prior Function Level of Independence: Independent      Comments: Independent  with Meal preparation, and medication management. Does not drive.   PT Goals (current goals can now be found in the care plan section) Progress towards PT goals: Progressing toward goals    Frequency    BID      PT Plan Current plan remains appropriate    Co-evaluation              AM-PAC PT "6 Clicks" Mobility   Outcome Measure  Help needed turning from your back to your side while in a flat bed without using bedrails?: Total Help needed moving from lying on your back to sitting on the side of a flat bed without using bedrails?: Total Help needed moving to and from a bed to a chair (including a wheelchair)?: A Lot Help needed standing up from a  chair using your arms (e.g., wheelchair or bedside chair)?: A Lot Help needed to walk in hospital room?: A Lot Help needed climbing 3-5 steps with a railing? : Total 6 Click Score: 9    End of Session Equipment Utilized During Treatment: Gait belt Activity Tolerance: Patient limited by pain Patient left: with call bell/phone within reach;with family/visitor present;with bed alarm set Nurse Communication: Mobility status PT Visit Diagnosis: Muscle weakness (generalized) (M62.81);Difficulty in walking, not elsewhere classified (R26.2);Pain Pain - Right/Left: Left Pain - part of body: Hip     Time: 4158-3094 PT Time Calculation (min) (ACUTE ONLY): 27 min  Charges:  $Gait Training: 8-22 mins $Therapeutic Exercise: 8-22 mins                     Kreg Shropshire, DPT 11/27/2018, 3:33 PM

## 2018-11-27 NOTE — Evaluation (Signed)
Occupational Therapy Evaluation Patient Details Name: Cassandra Maxwell MRN: [id number] DOB: 06-Dec-1936 Today's Date: 11/27/2018    History of Present Illness Pt. is an 82 y.o. female who was admitted to [facility] for ORIF repair of a Left Hip Fracture, and Left Proximal Humerus Fracture (nonsurgical) sustained during a  following a fall on the stairs when feeding her dog.   Clinical Impression   Pt. Presents with LUE immobilization, pain, weakness, limited activity tolerance, and limited functional mobility which limits her ability to complete basic ADL and IADL functioning. Pt. Resides at home with her son. Pt. Was independent with ADLs, and IADL functioning: including meal preparation, and medication management. Pt. Does not drive. Pt. Education was provided about A/E use, and compensatory strategies for one armed tasks while LUE is immobilized. Pt. Could benefit from OT services for ADL training, continued A/E training, and pt. Education about home modification, and DME. Pt. would benefit from SNF level of care upon discharge. Pt. could benefit from follow-up OT services at discharge.    Follow Up Recommendations  SNF    Equipment Recommendations  3 in 1 bedside commode    Recommendations for Other Services       Precautions / Restrictions Precautions Precautions: Fall      Mobility Bed Mobility                  Transfers                 General transfer comment: Deferred. Pt. seen for at bedside.    Balance                                           ADL either performed or assessed with clinical judgement   ADL Overall ADL's : Needs assistance/impaired Eating/Feeding: Set up;Minimal assistance   Grooming: Set up;Minimal assistance   Upper Body Bathing: Set up;Maximal assistance   Lower Body Bathing: Set up;Maximal assistance   Upper Body Dressing : Set up;Maximal assistance   Lower Body Dressing: Set up                        Vision Patient Visual Report: No change from baseline       Perception     Praxis      Pertinent Vitals/Pain Pain Assessment: No/denies pain Pain Score: 5      Hand Dominance     Extremity/Trunk Assessment Upper Extremity Assessment Upper Extremity Assessment: LUE deficits/detail LUE: Unable to fully assess due to immobilization           Communication Communication Communication: No difficulties   Cognition Arousal/Alertness: Awake/alert Behavior During Therapy: WFL for tasks assessed/performed Overall Cognitive Status: Within Functional Limits for tasks assessed                                     General Comments       Exercises     Shoulder Instructions      Home Living Family/patient expects to be discharged to:: Skilled nursing facility Living Arrangements: Children                                      Prior Functioning/Environment Level  of Independence: Independent        Comments: Independent  with Meal preparation, and medication management. Does not drive.        OT Problem List: Decreased strength;Decreased activity tolerance;Decreased knowledge of use of DME or AE;Impaired UE functional use;Decreased range of motion;Pain      OT Treatment/Interventions: Self-care/ADL training;Therapeutic exercise;DME and/or AE instruction;Visual/perceptual remediation/compensation;Patient/family education;Therapeutic activities    OT Goals(Current goals can be found in the care plan section)    OT Frequency: Min 2X/week   Barriers to D/C:            Co-evaluation              AM-PAC OT "6 Clicks" Daily Activity     Outcome Measure Help from another person eating meals?: A Little Help from another person taking care of personal grooming?: A Little Help from another person toileting, which includes using toliet, bedpan, or urinal?: A Lot Help from another person bathing (including washing, rinsing,  drying)?: A Lot Help from another person to put on and taking off regular upper body clothing?: A Lot Help from another person to put on and taking off regular lower body clothing?: A Lot 6 Click Score: 14   End of Session    Activity Tolerance: Patient tolerated treatment well Patient left: in bed;with call bell/phone within reach;with bed alarm set  OT Visit Diagnosis: Muscle weakness (generalized) (M62.81);Unsteadiness on feet (R26.81)                Time: 9798-9211 OT Time Calculation (min): 23 min Charges:  OT General Charges $OT Visit: 1 Visit OT Evaluation $OT Eval Moderate Complexity: 1 Mod  Harrel Carina, MS, OTR/L   Harrel Carina 11/27/2018, 3:09 PM

## 2018-11-27 NOTE — Evaluation (Signed)
Physical Therapy Evaluation Patient Details Name: Cassandra Maxwell MRN: [id number] DOB: [birth year] Today's Date: 11/27/2018   History of Present Illness  82 y/o female fell on steps while feeding her dog and suffered L proximal humeral fx (non-surgical) and L hip fx with ORIF 12/12.    Clinical Impression  Pt showed good effort with PT exam, mobility and ~10 minutes of exercises but had a lot of issues with pain and was very guarded t/o the session.  She needed max assist to get to EOB, but was able to rise to standing and take a few steps with hemi-walker despite a lot of hesitancy, pain.  She was hoping to be able to go home (does have good family support available 24/7) but realized just how limited she functionally is right now, agrees that she need STR until she is more mobile and confident.      Follow Up Recommendations SNF    Equipment Recommendations  (TBD at rehab, likely hemiwalker)    Recommendations for Other Services       Precautions / Restrictions Precautions Precautions: Fall Required Braces or Orthoses: Sling Restrictions Weight Bearing Restrictions: Yes LUE Weight Bearing: Non weight bearing LLE Weight Bearing: Weight bearing as tolerated      Mobility  Bed Mobility Overal bed mobility: Needs Assistance Bed Mobility: Supine to Sit     Supine to sit: Max assist     General bed mobility comments: Pt able to do some minimal side scooting in bed but not able to initiate getting to sitting upright w/o heavy assist  Transfers Overall transfer level: Needs assistance Equipment used: Hemi-walker Transfers: Sit to/from Stand Sit to Stand: Min assist;Mod assist         General transfer comment: Pt hesitant to get to standing/put weight on L but ultimately did show good effort with rising to standing with elevated surface, assist and heavy encouragement/cuing  Ambulation/Gait Ambulation/Gait assistance: Mod assist Gait Distance (Feet): 5 Feet Assistive  device: Hemi-walker       General Gait Details: Pt very guarded and anxious with WBing/ambulation.  She did relatively well using R UE on hemiwalker but clearly struggled to put a lot of weight on the L LE and had very cautious, guarded, small steps with modest effort of ambulation  Stairs            Wheelchair Mobility    Modified Rankin (Stroke Patients Only)       Balance Overall balance assessment: Needs assistance Sitting-balance support: Single extremity supported;Feet supported Sitting balance-Leahy Scale: Good Sitting balance - Comments: Pt able to maintain sitting balance relatively well, though clearly uncomfortable 2/2 pain   Standing balance support: Single extremity supported Standing balance-Leahy Scale: Poor Standing balance comment: Pt hesistant with L WBing and overall lacked confidence and safety with standing needing constant close assist and occasional phyiscal assist to maintain standing                             Pertinent Vitals/Pain Pain Assessment: 0-10 Pain Score: 8  Pain Location: significant pain with any movement L hip and shoudler    Home Living Family/patient expects to be discharged to:: Skilled nursing facility Living Arrangements: Children               Additional Comments: Pt hoping to go home, but understands likely need for rehab    Prior Function Level of Independence: Independent  Comments: Pt does not drive, but does occasionally get out of the house and is able to stay somewhat active     Hand Dominance        Extremity/Trunk Assessment   Upper Extremity Assessment Upper Extremity Assessment: (L in sling not tested, R WNL)    Lower Extremity Assessment Lower Extremity Assessment: (expected L LE post-op weakness, no SLR, pain with mvt)       Communication   Communication: No difficulties  Cognition Arousal/Alertness: Awake/alert Behavior During Therapy: WFL for tasks  assessed/performed Overall Cognitive Status: Within Functional Limits for tasks assessed                                        General Comments      Exercises General Exercises - Lower Extremity Ankle Circles/Pumps: AROM;10 reps Quad Sets: Strengthening;10 reps Gluteal Sets: AROM;10 reps Short Arc Quad: AROM;10 reps Heel Slides: AAROM;AROM;10 reps Hip ABduction/ADduction: AROM;10 reps Straight Leg Raises: (unable)   Assessment/Plan    PT Assessment Patient needs continued PT services  PT Problem List Decreased strength;Decreased range of motion;Decreased activity tolerance;Decreased balance;Decreased mobility;Decreased coordination;Decreased cognition;Decreased safety awareness;Decreased knowledge of use of DME;Pain;Cardiopulmonary status limiting activity       PT Treatment Interventions DME instruction;Gait training;Stair training;Functional mobility training;Therapeutic activities;Therapeutic exercise;Balance training;Cognitive remediation;Patient/family education;Neuromuscular re-education    PT Goals (Current goals can be found in the Care Plan section)  Acute Rehab PT Goals Patient Stated Goal: get back to her pets PT Goal Formulation: With patient Time For Goal Achievement: 12/11/18 Potential to Achieve Goals: Good    Frequency BID   Barriers to discharge        Co-evaluation               AM-PAC PT "6 Clicks" Mobility  Outcome Measure Help needed turning from your back to your side while in a flat bed without using bedrails?: Total Help needed moving from lying on your back to sitting on the side of a flat bed without using bedrails?: Total Help needed moving to and from a bed to a chair (including a wheelchair)?: A Lot Help needed standing up from a chair using your arms (e.g., wheelchair or bedside chair)?: A Lot Help needed to walk in hospital room?: A Lot Help needed climbing 3-5 steps with a railing? : Total 6 Click Score: 9     End of Session Equipment Utilized During Treatment: Gait belt Activity Tolerance: Patient limited by pain Patient left: with chair alarm set;with call bell/phone within reach;with family/visitor present Nurse Communication: Mobility status PT Visit Diagnosis: Muscle weakness (generalized) (M62.81);Difficulty in walking, not elsewhere classified (R26.2);Pain Pain - Right/Left: Left Pain - part of body: Hip    Time: 1610-9604 PT Time Calculation (min) (ACUTE ONLY): 31 min   Charges:   PT Evaluation $PT Eval Low Complexity: 1 Low PT Treatments $Therapeutic Exercise: 8-22 mins        Kreg Shropshire, DPT 11/27/2018, 12:01 PM

## 2018-11-27 NOTE — Progress Notes (Addendum)
[facility] [place] NAME: Cassandra Maxwell    MR#:  [id number]  DATE OF BIRTH:  Nov 12, 1936  SUBJECTIVE:   States she is doing fine this morning.  She is just having some minimal left hip pain.  States that her surgery went well.  REVIEW OF SYSTEMS:  Review of Systems  Constitutional: Negative for chills and fever.  HENT: Negative for congestion and sore throat.   Eyes: Negative for blurred vision and double vision.  Respiratory: Negative for cough and hemoptysis.   Cardiovascular: Negative for chest pain, palpitations and leg swelling.  Gastrointestinal: Negative for nausea and vomiting.  Genitourinary: Negative for dysuria and urgency.  Musculoskeletal: Positive for falls and joint pain. Negative for neck pain.  Neurological: Negative for dizziness and headaches.  Psychiatric/Behavioral: Negative for depression. The patient is not nervous/anxious.     DRUG ALLERGIES:  No Known Allergies VITALS:  Blood pressure (!) 128/54, pulse 72, temperature 98.6 F (37 C), temperature source Oral, resp. rate (!) 22, height 5\' 3"  (1.6 m), weight 79.8 kg, SpO2 97 %. PHYSICAL EXAMINATION:  Physical Exam  GENERAL:  82 y.o.-year-old patient lying in the bed with no acute distress.  EYES: Pupils equal, round, reactive to light and accommodation. No scleral icterus. Extraocular muscles intact.  HEENT: Head atraumatic, normocephalic. Oropharynx and nasopharynx clear.  NECK:  Supple, no jugular venous distention. No thyroid enlargement, no tenderness.  LUNGS: Normal breath sounds bilaterally, no wheezing, rales,rhonchi or crepitation. No use of accessory muscles of respiration.  CARDIOVASCULAR: RRR, S1, S2 normal. No murmurs, rubs, or gallops.  ABDOMEN: Soft, nontender, nondistended. Bowel sounds present. No organomegaly or mass.  EXTREMITIES: + Dry dressing in place over left lateral hip. NEUROLOGIC: Cranial nerves II through XII are intact. Muscle strength  5/5 in all extremities. Sensation intact. Gait not checked.  PSYCHIATRIC: The patient is alert and oriented x 3.  SKIN: No rash, lesion, or ulcer.  LABORATORY PANEL:  Female CBC Recent Labs  Lab 11/27/18 0431  WBC 9.2  HGB 10.3*  HCT 32.9*  PLT 163   ------------------------------------------------------------------------------------------------------------------ Chemistries  Recent Labs  Lab 11/27/18 0431  NA 138  K 4.0  CL 108  CO2 20*  GLUCOSE 158*  BUN 23  CREATININE 1.35*  CALCIUM 7.9*   RADIOLOGY:  Dg Hip Operative Unilat W Or W/o Pelvis Left  Result Date: 11/26/2018 CLINICAL DATA:  Hip fracture EXAM: OPERATIVE left HIP (WITH PELVIS IF PERFORMED) 5 VIEWS TECHNIQUE: Fluoroscopic spot image(s) were submitted for interpretation post-operatively. COMPARISON:  11/26/2018 FINDINGS: Five low resolution intraoperative spot views of the left hip. Total fluoroscopy time was 3 minutes 12 seconds. Images demonstrate intramedullary rod and distal screw fixation of the left femur for low trochanteric fracture. IMPRESSION: Intraoperative fluoroscopic assistance provided during surgical fixation of left femur fracture Electronically Signed   By: Donavan Foil M.D.   On: 11/26/2018 22:42   ASSESSMENT AND PLAN:   Left proximal femur fracture- s/p intramedullary nailing of the left femur. -Ortho following -Weightbearing as tolerated to the left leg  -Pain control -Restart Xarelto today -PT recommending SNF  Hypertension- blood pressures have improved -Continue home Norvasc  AKI versus chronic kidney disease stage III- creatinine slightly worse today.  Unclear baseline. -Stop IV fluids -Encourage p.o. intake -Avoid nephrotoxic agents  Chronic anticoagulation-patient is on Xarelto.  Unclear why.  Acute blood loss anemia-hemoglobin dropped from 12.8 > 10.3 after surgery.  No signs of active bleeding. -Will monitor closely, especially  as patient is on Xarelto  All the records are  reviewed and case discussed with Care Management/Social Worker. Management plans discussed with the patient, family and they are in agreement.  CODE STATUS: Full Code  TOTAL TIME TAKING CARE OF THIS PATIENT: 80 minutes.   More than 50% of the time was spent in counseling/coordination of care: YES  POSSIBLE D/C IN 1-2 DAYS, DEPENDING ON CLINICAL CONDITION.   Berna Spare Samay Delcarlo M.D on 11/27/2018 at 4:33 PM  Between 7am to 6pm - Pager - [phone]  After 6pm go to [email or web address] - Proofreader  [facility]  Office  [phone]  CC: Primary care physician; Petra Kuba, MD  Note: This dictation was prepared with Dragon dictation along with smaller phrase technology. Any transcriptional errors that result from this process are unintentional.

## 2018-11-28 LAB — BASIC METABOLIC PANEL
Anion gap: 6 (ref 5–15)
BUN: 25 mg/dL — ABNORMAL HIGH (ref 8–23)
CO2: 23 mmol/L (ref 22–32)
Calcium: 7.7 mg/dL — ABNORMAL LOW (ref 8.9–10.3)
Chloride: 107 mmol/L (ref 98–111)
Creatinine, Ser: 1.24 mg/dL — ABNORMAL HIGH (ref 0.44–1.00)
GFR calc Af Amer: 48 mL/min — ABNORMAL LOW (ref >60)
GFR calc non Af Amer: 41 mL/min — ABNORMAL LOW (ref >60)
Glucose, Bld: 100 mg/dL — ABNORMAL HIGH (ref 70–99)
Potassium: 3.4 mmol/L — ABNORMAL LOW (ref 3.5–5.1)
Sodium: 136 mmol/L (ref 135–145)

## 2018-11-28 LAB — CBC
HCT: 26.4 % — ABNORMAL LOW (ref 36.0–46.0)
Hemoglobin: 8.2 g/dL — ABNORMAL LOW (ref 12.0–15.0)
MCH: 28.3 pg (ref 26.0–34.0)
MCHC: 31.1 g/dL (ref 30.0–36.0)
MCV: 91 fL (ref 80.0–100.0)
Platelets: 126 10*3/uL — ABNORMAL LOW (ref 150–400)
RBC: 2.9 MIL/uL — ABNORMAL LOW (ref 3.87–5.11)
RDW: 13.5 % (ref 11.5–15.5)
WBC: 6.6 10*3/uL (ref 4.0–10.5)
nRBC: 0 % (ref 0.0–0.2)

## 2018-11-28 LAB — ABO/RH: ABO/RH(D): A POS

## 2018-11-28 LAB — PREPARE RBC (CROSSMATCH)

## 2018-11-28 LAB — HEMOGLOBIN AND HEMATOCRIT, BLOOD
HEMATOCRIT: 30.7 % — AB (ref 36.0–46.0)
Hemoglobin: 10.2 g/dL — ABNORMAL LOW (ref 12.0–15.0)

## 2018-11-28 MED ORDER — SODIUM CHLORIDE 0.9% IV SOLUTION
Freq: Once | INTRAVENOUS | Status: AC
  Administered 2018-11-28: 14:00:00 via INTRAVENOUS

## 2018-11-28 MED ORDER — PANTOPRAZOLE SODIUM 40 MG PO TBEC
40.0000 mg | DELAYED_RELEASE_TABLET | Freq: Every day | ORAL | Status: DC
  Administered 2018-11-28 – 2018-11-30 (×3): 40 mg via ORAL
  Filled 2018-11-28 (×3): qty 1

## 2018-11-28 NOTE — Progress Notes (Signed)
Occupational Therapy Treatment Patient Details Name: Cassandra Maxwell MRN: [id number] DOB: [birth year] Today's Date: 11/28/2018    History of present illness Pt. is an 82 y.o. female who was admitted to [facility] for ORIF repair of a Left Hip Fracture, and Left Proximal Humerus Fracture (nonsurgical) sustained during a fall on the stairs when feeding her dog.   OT comments  Pt was limited during this OT tx secondary to pain. Pt's pain level and request for meds reported to RN and therapy limited with pt's tolerance. Pt and OT do attempt one sit to stand with MAX A and pt is unable to ascend from chair d/t pain. Pt seated in recliner participates in OT-guided education re: adaptive equipment for LB ADLs including visual demonstration, falls prevention strategies, and d/c options. Pt verbalized understanding of topics covered, however unable to perform return demo of LB dsg with sock aid/reacher d/t pain and asks f/u questions re: d/c from hospital and rehab. OT assisted with what to expect with rehab and also referred pt to speak with social worker on f/u questions outside of OT scope. Pt left in chair with chair alarm set and several family members present. SNF continues to appear to be most appropriate at this time.    Follow Up Recommendations  SNF    Equipment Recommendations  3 in 1 bedside commode;Tub/shower seat    Recommendations for Other Services      Precautions / Restrictions Precautions Precautions: Fall Required Braces or Orthoses: Sling Restrictions Weight Bearing Restrictions: Yes LUE Weight Bearing: Non weight bearing LLE Weight Bearing: Weight bearing as tolerated       Mobility Bed Mobility     General bed mobility comments: Pt seated in recliner when OT presents for tx  Transfers         General transfer comment: unable to perform sit to stand at this time d/t pain. RN notified. Pt attempts with MAX A to push from recliner with R UE.     Balance Overall balance  assessment: Needs assistance                            ADL either performed or assessed with clinical judgement   ADL Overall ADL's : Needs assistance/impaired                     Lower Body Dressing: Moderate assistance Lower Body Dressing Details (indicate cue type and reason): Pt unable to perform any part of donning/doffing sock to L LE this date d/t pain in L hip, Pt educated on AE for LB ADLs and verbalized understanding.                      Vision Patient Visual Report: No change from baseline     Perception     Praxis      Cognition Arousal/Alertness: Awake/alert Behavior During Therapy: WFL for tasks assessed/performed Overall Cognitive Status: Within Functional Limits for tasks assessed                                 General Comments: Grandaughter reports some cognition changes at baseline         Exercises Other Exercises Other Exercises: Pt educated on use of AE for LB ADLs including visual demonstration and verbalized understanding. Unable to perform return demo at this time d/t pain.  Other Exercises:  Pt educated on falls prevention strategies and safety. Verbalized understanding.    Shoulder Instructions       General Comments      Pertinent Vitals/ Pain       Pain Assessment: 0-10 Pain Score: 7  Faces Pain Scale: Hurts even more Pain Location: L hip primarily aching this date. Increased pain in L hip and L shoudler with any attempt to mobilize.  Pain Descriptors / Indicators: Aching;Grimacing;Operative site guarding Pain Intervention(s): Limited activity within patient's tolerance;Monitored during session;Patient requesting pain meds-RN notified  Home Living                                          Prior Functioning/Environment              Frequency  Min 2X/week        Progress Toward Goals  OT Goals(current goals can now be found in the care plan section)  Progress  towards OT goals: Progressing toward goals  Acute Rehab OT Goals Patient Stated Goal: get back to her pets  Plan Discharge plan remains appropriate    Co-evaluation                 AM-PAC OT "6 Clicks" Daily Activity     Outcome Measure   Help from another person eating meals?: A Little   Help from another person toileting, which includes using toliet, bedpan, or urinal?: A Lot Help from another person bathing (including washing, rinsing, drying)?: A Lot Help from another person to put on and taking off regular upper body clothing?: A Lot Help from another person to put on and taking off regular lower body clothing?: A Lot 6 Click Score: 11    End of Session    OT Visit Diagnosis: Muscle weakness (generalized) (M62.81);Unsteadiness on feet (R26.81)   Activity Tolerance Patient limited by pain   Patient Left in chair;with chair alarm set;with family/visitor present   Nurse Communication Patient requests pain meds;Mobility status        Time: 1209-1236 OT Time Calculation (min): 27 min  Charges: OT General Charges $OT Visit: 1 Visit OT Treatments $Self Care/Home Management : 23-37 mins  Gerrianne Scale, MS, OTR/L ascom [phone] or [phone] 11/28/18, 12:54 PM

## 2018-11-28 NOTE — Progress Notes (Signed)
[facility] [place] NAME: Cassandra Maxwell    MR#:  [id number]  DATE OF BIRTH:  1936/02/03  SUBJECTIVE:   Patient doing okay this morning. Seen by orthopedics this morning and they are recommending blood transfusion. Family at bedside and says patient has mild dementia.  REVIEW OF SYSTEMS:    Review of Systems  Constitutional: Negative for fever, chills weight loss HENT: Negative for ear pain, nosebleeds, congestion, facial swelling, rhinorrhea, neck pain, neck stiffness and ear discharge.   Respiratory: Negative for cough, shortness of breath, wheezing  Cardiovascular: Negative for chest pain, palpitations and leg swelling.  Gastrointestinal: Negative for heartburn, abdominal pain, vomiting, diarrhea or consitpation Genitourinary: Negative for dysuria, urgency, frequency, hematuria Musculoskeletal: Negative for back pain or joint pain Neurological: Negative for dizziness, seizures, syncope, focal weakness,  numbness and headaches.  Hematological: Does not bruise/bleed easily.  Psychiatric/Behavioral: Negative for hallucinations, confusion, dysphoric mood    Tolerating Diet: yes      DRUG ALLERGIES:  No Known Allergies  VITALS:  Blood pressure 110/68, pulse 79, temperature 97.8 F (36.6 C), temperature source Oral, resp. rate 18, height 5\' 3"  (1.6 m), weight 79.8 kg, SpO2 98 %.  PHYSICAL EXAMINATION:  Constitutional: Appears well-developed and well-nourished. No distress. HENT: Normocephalic. Marland Kitchen Oropharynx is clear and moist.  Eyes: Conjunctivae and EOM are normal. PERRLA, no scleral icterus.  Neck: Normal ROM. Neck supple. No JVD. No tracheal deviation. CVS: RRR, S1/S2 +, no murmurs, no gallops, no carotid bruit.  Pulmonary: Effort and breath sounds normal, no stridor, rhonchi, wheezes, rales.  Abdominal: Soft. BS +,  no distension, tenderness, rebound or guarding.  Musculoskeletal: Normal range of motion. No edema and no  tenderness.  Neuro: Alert. CN 2-12 grossly intact. No focal deficits. Skin: Skin is warm and dry. No rash noted. Psychiatric: Normal mood and affect.   No major drainage from incision site   LABORATORY PANEL:   CBC Recent Labs  Lab 11/28/18 0400  WBC 6.6  HGB 8.2*  HCT 26.4*  PLT 126*   ------------------------------------------------------------------------------------------------------------------  Chemistries  Recent Labs  Lab 11/28/18 0400  NA 136  K 3.4*  CL 107  CO2 23  GLUCOSE 100*  BUN 25*  CREATININE 1.24*  CALCIUM 7.7*   ------------------------------------------------------------------------------------------------------------------  Cardiac Enzymes No results for input(s): TROPONINI in the last 168 hours. ------------------------------------------------------------------------------------------------------------------  RADIOLOGY:  Ct Shoulder Left Wo Contrast  Result Date: 11/26/2018 CLINICAL DATA:  The patient suffered a left shoulder injury in a fall off a porch this morning. Initial encounter. EXAM: CT OF THE UPPER LEFT EXTREMITY WITHOUT CONTRAST TECHNIQUE: Multidetector CT imaging of the upper left extremity was performed according to the standard protocol. COMPARISON:  Plain films left shoulder earlier today FINDINGS: Bones/Joint/Cartilage The patient has an acute transverse fracture of the proximal diaphysis of the humerus at and just below the surgical neck. There is impaction of approximately 1 cm and anterior displacement of the shaft of the humerus of approximately 0.6 cm. The fracture does not involve the greater or lesser tuberosities. The humeral head is located. The acromioclavicular joint is intact and appears normal. No lytic or sclerotic lesion is identified. The acromion is type 1. Ligaments Suboptimally assessed by CT. Muscles and Tendons Musculature of the shoulder girdle is preserved in the rotator cuff appears intact Soft tissues Imaged  lung parenchyma is clear.  Atherosclerosis is noted. IMPRESSION: Slightly impacted and anteriorly displaced fracture of the proximal left humerus without involvement of the greater  or lesser tuberosities. No other acute abnormality. Atherosclerosis. Electronically Signed   By: Inge Rise M.D.   On: 11/26/2018 12:37   Dg Hip Operative Unilat W Or W/o Pelvis Left  Result Date: 11/26/2018 CLINICAL DATA:  Hip fracture EXAM: OPERATIVE left HIP (WITH PELVIS IF PERFORMED) 5 VIEWS TECHNIQUE: Fluoroscopic spot image(s) were submitted for interpretation post-operatively. COMPARISON:  11/26/2018 FINDINGS: Five low resolution intraoperative spot views of the left hip. Total fluoroscopy time was 3 minutes 12 seconds. Images demonstrate intramedullary rod and distal screw fixation of the left femur for low trochanteric fracture. IMPRESSION: Intraoperative fluoroscopic assistance provided during surgical fixation of left femur fracture Electronically Signed   By: Donavan Foil M.D.   On: 11/26/2018 22:42   Dg Femur Min 2 Views Left  Result Date: 11/26/2018 CLINICAL DATA:  82 year old who slipped and fell on icy steps outside earlier today, injuring her LEFT hip. Initial encounter. EXAM: LEFT FEMUR 2 VIEWS COMPARISON:  None. FINDINGS: Comminuted fracture involving the proximal metaphysis at the level of the lesser trochanter. This fracture does not involve the greater trochanter, and is therefore not technically an intertrochanteric fracture. No fractures elsewhere involving the femur. Mild osseous demineralization. Hip joint anatomically aligned with mild joint space narrowing. IMPRESSION: Acute comminuted fracture involving the proximal metaphysis of the LEFT femur at the level of the lesser trochanter (without involvement of the greater trochanter). Electronically Signed   By: Evangeline Dakin M.D.   On: 11/26/2018 12:12     ASSESSMENT AND PLAN:   82 year old female with history of dementia and essential  hypertension who presented after a fall and found to have left proximal femur fracture.  1.  Left proximal femur fracture: Patient is postoperative day #2 status post intramedullary nailing of left femur. Physical therapy is recommending skilled nursing facility upon discharge. Continue Xarelto for DVT prophylaxis Weightbearing as tolerated to left leg  2.  Acute blood loss anemia on chronic anemia: Acute blood losses postoperative. Transfuse 1 unit PRBC as per recommendations by Ortho  3.  Essential hypertension: Continue Norvasc  4.  Acute kidney injury on chronic kidney disease stage III: Creatinine has improved  5.  Chronic anticoagulation on Xarelto      Management plans discussed with the patient and family and they are in agreement.  CODE STATUS: Full  TOTAL TIME TAKING CARE OF THIS PATIENT: 25 minutes.     POSSIBLE D/C tomorrow, DEPENDING ON CLINICAL CONDITION.   Markiesha Delia M.D on 11/28/2018 at 11:45 AM  Between 7am to 6pm - Pager - [phone] After 6pm go to [email or web address] - password [email or web address] [facility]  Office  [phone]  CC: Primary care physician; Petra Kuba, MD  Note: This dictation was prepared with Dragon dictation along with smaller phrase technology. Any transcriptional errors that result from this process are unintentional.

## 2018-11-28 NOTE — Progress Notes (Signed)
Physical Therapy Treatment Patient Details Name: Cassandra Maxwell MRN: [id number] DOB: [birth year] Today's Date: 11/28/2018    History of Present Illness Pt. is an 82 y.o. female who was admitted to [facility] for ORIF repair of a Left Hip Fracture, and Left Proximal Humerus Fracture (nonsurgical) sustained during a  following a fall on the stairs when feeding her dog.    PT Comments    Participated in exercises as described below.  Granddaughter in and helpful during session.  To edge of bed with max a x 1.  Once sitting she was able to sit unsupported but requires supervision for safety.  Stood with hemi walker and mod a x 1 but she was unable to stand fully or attempt safe steps to recliner at bedside.  After sitting, she was able to stand again without AD and holding my arm with her left and and with encouragement take several small steps towards recliner but quickly sat upon turning.  Pt requires verbal cues not to use left arm frequently during session.   Remained up after session.   Follow Up Recommendations  SNF     Equipment Recommendations  Other (comment)    Recommendations for Other Services       Precautions / Restrictions Precautions Precautions: Fall Required Braces or Orthoses: Sling Restrictions Weight Bearing Restrictions: Yes LUE Weight Bearing: Non weight bearing LLE Weight Bearing: Weight bearing as tolerated    Mobility  Bed Mobility Overal bed mobility: Needs Assistance Bed Mobility: Supine to Sit     Supine to sit: Max assist        Transfers Overall transfer level: Needs assistance Equipment used: Hemi-walker;None Transfers: Sit to/from Stand Sit to Stand: Mod assist         General transfer comment: stood x 1 with HW but she was uanble to stand fully and fearful.  Unable to step today.  Second attempt without HW with pt holding my arm with her left and she was able to stand more upright and take several small steps to recliner but sat quickly upon  turning.  Ambulation/Gait   Gait Distance (Feet): 2 Feet Assistive device: 1 person hand held assist Gait Pattern/deviations: Step-to pattern;Decreased step length - right;Decreased step length - left;Shuffle Gait velocity: decreased   General Gait Details: Was not able to walk or take steps as well as yesterday.  Pain and fear seemed to be limiting.     Stairs             Wheelchair Mobility    Modified Rankin (Stroke Patients Only)       Balance Overall balance assessment: Needs assistance Sitting-balance support: Feet supported;Single extremity supported Sitting balance-Leahy Scale: Fair Sitting balance - Comments: Requires supervision for safety   Standing balance support: Single extremity supported Standing balance-Leahy Scale: Poor Standing balance comment: highly reliant on R UE and very guarded with WBing on L LE`                            Cognition Arousal/Alertness: Awake/alert Behavior During Therapy: WFL for tasks assessed/performed Overall Cognitive Status: Within Functional Limits for tasks assessed                                 General Comments: Grandaughter reports some cognition changes at baseline       Exercises Other Exercises Other Exercises: supine LLE AAROM for  ankle pumps, heel slides, ab/add x 10    General Comments        Pertinent Vitals/Pain Pain Assessment: Faces Faces Pain Scale: Hurts even more Pain Location: L shoulder > L hip, increased pain in both with any movement Pain Descriptors / Indicators: Operative site guarding;Grimacing;Guarding;Sore Pain Intervention(s): Limited activity within patient's tolerance;Monitored during session;Premedicated before session    Home Living                      Prior Function            PT Goals (current goals can now be found in the care plan section) Progress towards PT goals: Progressing toward goals    Frequency    BID      PT  Plan Current plan remains appropriate    Co-evaluation              AM-PAC PT "6 Clicks" Mobility   Outcome Measure  Help needed turning from your back to your side while in a flat bed without using bedrails?: Total Help needed moving from lying on your back to sitting on the side of a flat bed without using bedrails?: Total Help needed moving to and from a bed to a chair (including a wheelchair)?: A Lot Help needed standing up from a chair using your arms (e.g., wheelchair or bedside chair)?: A Lot Help needed to walk in hospital room?: A Lot Help needed climbing 3-5 steps with a railing? : Total 6 Click Score: 9    End of Session Equipment Utilized During Treatment: Gait belt Activity Tolerance: Patient limited by pain Patient left: with call bell/phone within reach;with family/visitor present;in chair;with chair alarm set   Pain - Right/Left: Left Pain - part of body: Hip     Time: 0017-4944 PT Time Calculation (min) (ACUTE ONLY): 23 min  Charges:  $Therapeutic Exercise: 8-22 mins $Therapeutic Activity: 8-22 mins                    Chesley Noon, PTA 11/28/18, 10:28 AM

## 2018-11-28 NOTE — Progress Notes (Signed)
   Subjective: 2 Days Post-Op Procedure(s) (LRB): INTRAMEDULLARY (IM) NAIL INTERTROCHANTRIC (Left) Patient reports pain as moderate.   Patient is well, and has had no acute complaints or problems Patient slow with rehab secondary to increased pain.  Was able to ambulate 7 feet yesterday. Plan is to go Rehab after hospital stay. no nausea and no vomiting Patient denies any chest pains or shortness of breath. Patient voices no new complaints.  Objective: Vital signs in last 24 hours: Temp:  [97.8 F (36.6 C)-98 F (36.7 C)] 97.8 F (36.6 C) (12/14 0827) Pulse Rate:  [74-101] 74 (12/14 0827) Resp:  [14-18] 18 (12/14 0827) BP: (108-112)/(56-65) 108/65 (12/14 0827) SpO2:  [95 %-98 %] 98 % (12/14 0827) well approximated incision Heels are non tender and elevated off the bed using rolled towels Intake/Output from previous day: 12/13 0701 - 12/14 0700 In: -  Out: 500 [Urine:500] Intake/Output this shift: No intake/output data recorded.  Recent Labs    11/26/18 1024 11/27/18 0431 11/28/18 0400  HGB 12.8 10.3* 8.2*   Recent Labs    11/27/18 0431 11/28/18 0400  WBC 9.2 6.6  RBC 3.62* 2.90*  HCT 32.9* 26.4*  PLT 163 126*   Recent Labs    11/27/18 0431 11/28/18 0400  NA 138 136  K 4.0 3.4*  CL 108 107  CO2 20* 23  BUN 23 25*  CREATININE 1.35* 1.24*  GLUCOSE 158* 100*  CALCIUM 7.9* 7.7*   Recent Labs    11/26/18 1024  INR 1.00    EXAM General - Patient is Alert, Appropriate and Oriented Extremity - Neurologically intact Neurovascular intact Sensation intact distally Intact pulses distally Dorsiflexion/Plantar flexion intact No cellulitis present Compartment soft Dressing - scant drainage to the proximal incision site.  Distal incision site clear Motor Function - intact, moving foot and toes well on exam.    Past Medical History:  Diagnosis Date  . Breast cancer (HCC)     Assessment/Plan: 2 Days Post-Op Procedure(s) (LRB): INTRAMEDULLARY (IM) NAIL  INTERTROCHANTRIC (Left) Active Problems:   Hip fracture requiring operative repair, left, closed, initial encounter ([place])  Estimated body mass index is 31.18 kg/m as calculated from the following:   Height as of this encounter: 5\' 3"  (1.6 m).   Weight as of this encounter: 79.8 kg. Up with therapy Discharge to SNF when medically clear.  Labs: Hemoglobin has dropped to 8.2. DVT Prophylaxis - Xarelto, Foot Pumps and TED hose Weight-Bearing as tolerated to left leg Patient needs a bowel movement prior to being discharged. Would probably consider transfusing at least 1 unit of blood secondary to going from 12.8 2 days ago to 10.3 yesterday and now down to 8.2.  Also slightly tachycardia at 101.  Blood pressure soft Continue Xarelto at discharge Follow-up with neuro clinic 6 weeks Staples will need to be removed 2 weeks postop TED stockings need to be worn 6 weeks.  Jillyn Ledger. [place] 11/28/2018, 8:33 AM

## 2018-11-28 NOTE — Progress Notes (Signed)
PT Cancellation Note  Patient Details Name: Cassandra Maxwell MRN: [id number] DOB: March 27, 1936   Cancelled Treatment:    Reason Eval/Treat Not Completed: Medical issues which prohibited therapy;Other (comment)(Chart reviewed, RN consulted: Pt getting blood at this time. Will attemp treatment again at later date/time. )  4:13 PM, 11/28/18 Etta Grandchild, PT, DPT Physical Therapist - [facility]  [phone] ([place])     [place] Torok C 11/28/2018, 4:13 PM

## 2018-11-29 ENCOUNTER — Inpatient Hospital Stay: Payer: Medicare Other

## 2018-11-29 LAB — CBC
HCT: 29.2 % — ABNORMAL LOW (ref 36.0–46.0)
Hemoglobin: 9.3 g/dL — ABNORMAL LOW (ref 12.0–15.0)
MCH: 29.1 pg (ref 26.0–34.0)
MCHC: 31.8 g/dL (ref 30.0–36.0)
MCV: 91.3 fL (ref 80.0–100.0)
Platelets: 123 10*3/uL — ABNORMAL LOW (ref 150–400)
RBC: 3.2 MIL/uL — ABNORMAL LOW (ref 3.87–5.11)
RDW: 13.6 % (ref 11.5–15.5)
WBC: 6.8 10*3/uL (ref 4.0–10.5)
nRBC: 0 % (ref 0.0–0.2)

## 2018-11-29 LAB — TYPE AND SCREEN
ABO/RH(D): A POS
ANTIBODY SCREEN: NEGATIVE
UNIT DIVISION: 0

## 2018-11-29 LAB — BPAM RBC
Blood Product Expiration Date: 202001102359
ISSUE DATE / TIME: 201912141410
Unit Type and Rh: 6200

## 2018-11-29 NOTE — Progress Notes (Signed)
Physical Therapy Treatment Patient Details Name: Cassandra Maxwell MRN: [id number] DOB: 04/28/1936 Today's Date: 11/29/2018    History of Present Illness Pt. is an 82 y.o. female who was admitted to [facility] for ORIF repair of a Left Hip Fracture, and Left Proximal Humerus Fracture (nonsurgical) sustained during a fall on the stairs when feeding her dog.    PT Comments    Pt received in bed, Rt trunk lean, sling donned, LUE poorly supported. LUE propped with pillows which helps with pain. LLE bed exercises with improved tolerance, but pain still limiting exercises, most performed at 0.20Hz . To EOB still very weak requires max-TotalAssist, but able to tolerate sitting EOB for several minutes as dizziness improves, albeit queasiness remains, which she says has been intermittent all morning. Pt reports minimal PO at breakfast as she is not a 'breakfast person' and DTR encourages more ad lib PO to help with nausea.  Seated BP mildly hypertensive. Pt agreeable to stay up at EOB for ~15-29min or longer as tolerated, DTR assisting.    Follow Up Recommendations  SNF     Equipment Recommendations  Other (comment)    Recommendations for Other Services       Precautions / Restrictions Precautions Precautions: Fall Required Braces or Orthoses: Sling Restrictions LUE Weight Bearing: Non weight bearing LLE Weight Bearing: Weight bearing as tolerated    Mobility  Bed Mobility Overal bed mobility: Needs Assistance Bed Mobility: Supine to Sit     Supine to sit: Max assist     General bed mobility comments: max trunk assist and total assist of LLE requires assist for pivot to EOB  Transfers Overall transfer level: (deferred pt queasy at EOB)                  Ambulation/Gait                 Stairs             Wheelchair Mobility    Modified Rankin (Stroke Patients Only)       Balance                                            Cognition  Arousal/Alertness: Awake/alert Behavior During Therapy: WFL for tasks assessed/performed Overall Cognitive Status: Within Functional Limits for tasks assessed                                        Exercises General Exercises - Lower Extremity Gluteal Sets: 10 reps;Left;Strengthening;Supine(isometrics, pillow under heel) Short Arc Quad: AROM;10 reps;15 reps;Left;Supine Heel Slides: AAROM;10 reps;Supine;Left Hip ABduction/ADduction: 15 reps;AAROM;Left    General Comments        Pertinent Vitals/Pain Pain Assessment: 0-10 Pain Score: 6  Pain Location: Left arm, improved with sling/pillow adjustment  Pain Intervention(s): Limited activity within patient's tolerance;Premedicated before session;Repositioned;Relaxation    Home Living                      Prior Function            PT Goals (current goals can now be found in the care plan section) Acute Rehab PT Goals Patient Stated Goal: get back to her pets PT Goal Formulation: With patient Time For Goal Achievement: 12/11/18 Potential to Achieve Goals: Good  Progress towards PT goals: PT to reassess next treatment    Frequency    BID      PT Plan Current plan remains appropriate    Co-evaluation              AM-PAC PT "6 Clicks" Mobility   Outcome Measure  Help needed turning from your back to your side while in a flat bed without using bedrails?: Total Help needed moving from lying on your back to sitting on the side of a flat bed without using bedrails?: Total Help needed moving to and from a bed to a chair (including a wheelchair)?: Total Help needed standing up from a chair using your arms (e.g., wheelchair or bedside chair)?: A Lot Help needed to walk in hospital room?: A Lot Help needed climbing 3-5 steps with a railing? : Total 6 Click Score: 8    End of Session Equipment Utilized During Treatment: Gait belt Activity Tolerance: Patient tolerated treatment well;Other  (comment)(queasy at end of sessio) Patient left: in bed;with family/visitor present;with call bell/phone within reach Nurse Communication: Mobility status PT Visit Diagnosis: Muscle weakness (generalized) (M62.81);Difficulty in walking, not elsewhere classified (R26.2);Pain Pain - Right/Left: Left Pain - part of body: Hip;Shoulder     Time: 4128-2081 PT Time Calculation (min) (ACUTE ONLY): 32 min  Charges:  $Therapeutic Exercise: 8-22 mins $Therapeutic Activity: 8-22 mins                     9:55 AM, 11/29/18 Etta Grandchild, PT, DPT Physical Therapist - [facility]  [phone] ([place])     Buccola,Allan C 11/29/2018, 9:51 AM

## 2018-11-29 NOTE — Clinical Social Work Note (Signed)
The CSW attempted to contact [facility] to ask about pending insurance auth. The auth request is still pending. The facility cannot accept the patient until Josem Kaufmann is received. CSW is following.  Santiago Bumpers, MSW, Latanya Presser [phone]

## 2018-11-29 NOTE — Progress Notes (Signed)
[facility] [place] NAME: Cassandra Maxwell    MR#:  [id number]  DATE OF BIRTH:  10-18-1936  SUBJECTIVE:  No BM yet Had fevefr 100.6 last night No SOB or chest pain no urinary symptoms  REVIEW OF SYSTEMS:    Review of Systems  Constitutional: +for fever, mo chills weight loss HENT: Negative for ear pain, nosebleeds, congestion, facial swelling, rhinorrhea, neck pain, neck stiffness and ear discharge.   Respiratory: Negative for cough, shortness of breath, wheezing  Cardiovascular: Negative for chest pain, palpitations and leg swelling.  Gastrointestinal: Negative for heartburn, abdominal pain, vomiting, diarrhea or consitpation Genitourinary: Negative for dysuria, urgency, frequency, hematuria Musculoskeletal: Negative for back pain or joint pain Neurological: Negative for dizziness, seizures, syncope, focal weakness,  numbness and headaches.  Hematological: Does not bruise/bleed easily.  Psychiatric/Behavioral: Negative for hallucinations, confusion, dysphoric mood    Tolerating Diet: yes      DRUG ALLERGIES:  No Known Allergies  VITALS:  Blood pressure 136/76, pulse 63, temperature 98.4 F (36.9 C), temperature source Oral, resp. rate 18, height 5\' 3"  (1.6 m), weight 79.8 kg, SpO2 98 %.  PHYSICAL EXAMINATION:  Constitutional: Appears well-developed and well-nourished. No distress. HENT: Normocephalic. Marland Kitchen Oropharynx is clear and moist.  Eyes: Conjunctivae and EOM are normal. PERRLA, no scleral icterus.  Neck: Normal ROM. Neck supple. No JVD. No tracheal deviation. CVS: RRR, S1/S2 +, no murmurs, no gallops, no carotid bruit.  Pulmonary: Effort and breath sounds normal, no stridor, rhonchi, wheezes, rales.  Abdominal: Soft. BS +,  no distension, tenderness, rebound or guarding.  Musculoskeletal: Normal range of motion. No edema and no tenderness.  Neuro: Alert. CN 2-12 grossly intact. No focal deficits. Skin: Skin is warm and dry.  No rash noted. Psychiatric: Normal mood and affect.   No major drainage from incision site   LABORATORY PANEL:   CBC Recent Labs  Lab 11/29/18 0418  WBC 6.8  HGB 9.3*  HCT 29.2*  PLT 123*   ------------------------------------------------------------------------------------------------------------------  Chemistries  Recent Labs  Lab 11/28/18 0400  NA 136  K 3.4*  CL 107  CO2 23  GLUCOSE 100*  BUN 25*  CREATININE 1.24*  CALCIUM 7.7*   ------------------------------------------------------------------------------------------------------------------  Cardiac Enzymes No results for input(s): TROPONINI in the last 168 hours. ------------------------------------------------------------------------------------------------------------------  RADIOLOGY:  Dg Chest 1 View  Result Date: 11/29/2018 CLINICAL DATA:  Fever. EXAM: CHEST  1 VIEW COMPARISON:  November 16, 2018 FINDINGS: The heart, hila, mediastinum, lungs, and pleura are unremarkable. No acute abnormalities. IMPRESSION: No cause for fever identified. Known left proximal humeral fracture. Electronically Signed   By: Dorise Bullion III M.D   On: 11/29/2018 07:38     ASSESSMENT AND PLAN:   82 year old female with history of dementia and essential hypertension who presented after a fall and found to have left proximal femur fracture.  1.  Left proximal femur fracture: Patient is postoperative day #3 status post intramedullary nailing of left femur. Physical therapy is recommending skilled nursing facility upon discharge. Continue Xarelto for DVT prophylaxis Weightbearing as tolerated to left leg  2.  Acute blood loss anemia on chronic anemia: Acute blood losses postoperative. S/p 1 unit PRBC Recheck in am  3.  Essential hypertension: Continue Norvasc  4.  Acute kidney injury on chronic kidney disease stage III: Creatinine has improved  5.  Chronic anticoagulation on Xarelto  6. Fever: CXR negative for  PNA Check UA     Management plans discussed with the  patient and family and they are in agreement.  CODE STATUS: Full  TOTAL TIME TAKING CARE OF THIS PATIENT: 25 minutes.     POSSIBLE D/C tomorrow, DEPENDING ON CLINICAL CONDITION.   Gurpreet Mikhail M.D on 11/29/2018 at 10:41 AM  Between 7am to 6pm - Pager - [phone] After 6pm go to [email or web address] - password [email or web address] [facility]  Office  [phone]  CC: Primary care physician; Petra Kuba, MD  Note: This dictation was prepared with Dragon dictation along with smaller phrase technology. Any transcriptional errors that result from this process are unintentional.

## 2018-11-29 NOTE — Progress Notes (Signed)
   Subjective: 3 Days Post-Op Procedure(s) (LRB): INTRAMEDULLARY (IM) NAIL INTERTROCHANTRIC (Left) Patient reports pain as mild.   Patient is well, and has had no acute complaints or problems Progressing slowly with therapy.  No therapy yesterday secondary to patient receiving blood Plan is to go Rehab after hospital stay. no nausea and no vomiting Patient denies any chest pains or shortness of breath. No real change in patient's condition this morning Did receive 1 unit of packed RBCs yesterday  Objective: Vital signs in last 24 hours: Temp:  [97.4 F (36.3 C)-100.6 F (38.1 C)] 98.4 F (36.9 C) (12/15 0817) Pulse Rate:  [63-92] 63 (12/15 0817) Resp:  [17-18] 18 (12/15 0817) BP: (110-121)/(56-68) 118/56 (12/15 0817) SpO2:  [93 %-98 %] 98 % (12/15 0817) well approximated incision Heels are non tender and elevated off the bed using rolled towels Intake/Output from previous day: 12/14 0701 - 12/15 0700 In: 377.2 [I.V.:33; Blood:344.2] Out: 750 [Urine:750] Intake/Output this shift: No intake/output data recorded.  Recent Labs    11/26/18 1024 11/27/18 0431 11/28/18 0400 11/28/18 1819 11/29/18 0418  HGB 12.8 10.3* 8.2* 10.2* 9.3*   Recent Labs    11/28/18 0400 11/28/18 1819 11/29/18 0418  WBC 6.6  --  6.8  RBC 2.90*  --  3.20*  HCT 26.4* 30.7* 29.2*  PLT 126*  --  123*   Recent Labs    11/27/18 0431 11/28/18 0400  NA 138 136  K 4.0 3.4*  CL 108 107  CO2 20* 23  BUN 23 25*  CREATININE 1.35* 1.24*  GLUCOSE 158* 100*  CALCIUM 7.9* 7.7*   Recent Labs    11/26/18 1024  INR 1.00    EXAM General - Patient is Alert, Appropriate and Oriented Extremity - Neurologically intact Neurovascular intact Sensation intact distally Intact pulses distally Dorsiflexion/Plantar flexion intact No cellulitis present Compartment soft Dressing - scant drainage Motor Function - intact, moving foot and toes well on exam.    Past Medical History:  Diagnosis Date  .  Breast cancer (HCC)     Assessment/Plan: 3 Days Post-Op Procedure(s) (LRB): INTRAMEDULLARY (IM) NAIL INTERTROCHANTRIC (Left) Active Problems:   Hip fracture requiring operative repair, left, closed, initial encounter ([place])  Estimated body mass index is 31.18 kg/m as calculated from the following:   Height as of this encounter: 5\' 3"  (1.6 m).   Weight as of this encounter: 79.8 kg. Up with therapy Discharge to SNF when medically clear  Labs: Hemoglobin 9.3.  Remains asymptomatic DVT Prophylaxis - Xarelto, Foot Pumps and TED hose Weight-Bearing as tolerated to left leg Continue Xarelto at discharge Follow-up with neuro clinic 6 weeks Staples will need to be removed 2 weeks postop TED stockings need to be worn 6 weeks. Please change dressing prior to patient may discharge    Jillyn Ledger. [place] 11/29/2018, 8:52 AM

## 2018-11-29 NOTE — Discharge Summary (Addendum)
[facility] [place] NAME: Cassandra Maxwell    MR#:  [id number]  DATE OF BIRTH:  17-Jan-1936  DATE OF ADMISSION:  11/26/2018 ADMITTING PHYSICIAN: Loletha Grayer, MD  DATE OF DISCHARGE: 11/30/2018  PRIMARY CARE PHYSICIAN: Petra Kuba, MD    ADMISSION DIAGNOSIS:  Pre-op chest exam [N36.144]  DISCHARGE DIAGNOSIS:  Active Problems:   Hip fracture requiring operative repair, left, closed, initial encounter [facility])   SECONDARY DIAGNOSIS:   Past Medical History:  Diagnosis Date  . Breast cancer [facility])     HOSPITAL COURSE:   82 year old female with history of dementia and essential hypertension who presented after a fall and found to have left proximal femur fracture.  1.  Left proximal femur fracture: Patient is postoperative day #3 status post intramedullary nailing of left femur. Physical therapy is recommending skilled nursing facility upon discharge. Continue Xarelto for DVT prophylaxis Weightbearing as tolerated to left leg TED stockings need to be worn for 6 weeks Staples will be removed in 2 weeks Follow-up with orthopedic surgery in 1 to 2 weeks. Dressing change prior to discharge   2.  Acute blood loss anemia on chronic anemia: Acute blood loss due to postoperative blood loss. Patient is status post 1 unit PRBC.  I recommend CBC to be checked in 2 days.  3.  Essential hypertension: Continue Norvasc  4.  Acute kidney injury on chronic kidney disease stage III: Creatinine has improved  5.  Chronic anticoagulation on Xarelto   DISCHARGE CONDITIONS AND DIET:   Stable for discharge on regular diet  CONSULTS OBTAINED:  Treatment Team:  Leim Fabry, MD Bettey Costa, MD  DRUG ALLERGIES:  No Known Allergies  DISCHARGE MEDICATIONS:   Allergies as of 11/30/2018   No Known Allergies     Medication List    TAKE these medications   amLODipine 5 MG tablet Commonly known as:  NORVASC Take 5 mg by mouth daily.    loratadine 10 MG tablet Commonly known as:  CLARITIN Take 10 mg by mouth daily as needed for allergies.   oxyCODONE 5 MG immediate release tablet Commonly known as:  Oxy IR/ROXICODONE Take 1-2 tablets (5-10 mg total) by mouth every 6 (six) hours as needed for severe pain (pain score 7-10).   rivaroxaban 10 MG Tabs tablet Commonly known as:  XARELTO Take 10 mg by mouth every evening.   traMADol 50 MG tablet Commonly known as:  ULTRAM Take 1 tablet (50 mg total) by mouth every 6 (six) hours as needed for moderate pain.         Today   CHIEF COMPLAINT:  Patient had low-grade fever last night but no fever this morning.  Chest x-ray shows no evidence of pneumonia.  She has no urinary symptoms.  She is doing well. She has not had a bowel movement yet VITAL SIGNS:  Blood pressure (!) 124/58, pulse (!) 59, temperature 97.9 F (36.6 C), temperature source Oral, resp. rate 18, height 5\' 3"  (1.6 m), weight 79.8 kg, SpO2 97 %.   REVIEW OF SYSTEMS:  Review of Systems  Constitutional: Negative.  Negative for chills, fever and malaise/fatigue.  HENT: Negative.  Negative for ear discharge, ear pain, hearing loss, nosebleeds and sore throat.   Eyes: Negative.  Negative for blurred vision and pain.  Respiratory: Negative.  Negative for cough, hemoptysis, shortness of breath and wheezing.   Cardiovascular: Negative.  Negative for chest pain, palpitations and leg swelling.  Gastrointestinal: Negative for abdominal  pain, blood in stool, constipation, diarrhea, nausea and vomiting.  Genitourinary: Negative.  Negative for dysuria.  Musculoskeletal: Negative.  Negative for back pain.  Skin: Negative.   Neurological: Negative for dizziness, tremors, speech change, focal weakness, seizures and headaches.  Endo/Heme/Allergies: Negative.  Does not bruise/bleed easily.  Psychiatric/Behavioral: Negative.  Negative for depression, hallucinations and suicidal ideas.     PHYSICAL EXAMINATION:   GENERAL:  82 y.o.-year-old patient lying in the bed with no acute distress.  NECK:  Supple, no jugular venous distention. No thyroid enlargement, no tenderness.  LUNGS: Normal breath sounds bilaterally, no wheezing, rales,rhonchi  No use of accessory muscles of respiration.  CARDIOVASCULAR: S1, S2 normal. No murmurs, rubs, or gallops.  ABDOMEN: Soft, non-tender, non-distended. Bowel sounds present. No organomegaly or mass.  EXTREMITIES: No pedal edema, cyanosis, or clubbing.  PSYCHIATRIC: The patient is alert and oriented x 3.  SKIN: No obvious rash, lesion, or ulcer.   DATA REVIEW:   CBC Recent Labs  Lab 11/30/18 0334  WBC 6.4  HGB 9.3*  HCT 29.7*  PLT 169    Chemistries  Recent Labs  Lab 11/30/18 0334  NA 137  K 3.7  CL 104  CO2 25  GLUCOSE 113*  BUN 15  CREATININE 1.06*  CALCIUM 8.0*    Cardiac Enzymes No results for input(s): TROPONINI in the last 168 hours.  Microbiology Results  @MICRORSLT48 @  RADIOLOGY:  Dg Chest 1 View  Result Date: 11/29/2018 CLINICAL DATA:  Fever. EXAM: CHEST  1 VIEW COMPARISON:  November 16, 2018 FINDINGS: The heart, hila, mediastinum, lungs, and pleura are unremarkable. No acute abnormalities. IMPRESSION: No cause for fever identified. Known left proximal humeral fracture. Electronically Signed   By: Dorise Bullion III M.D   On: 11/29/2018 07:38      Allergies as of 11/30/2018   No Known Allergies     Medication List    TAKE these medications   amLODipine 5 MG tablet Commonly known as:  NORVASC Take 5 mg by mouth daily.   loratadine 10 MG tablet Commonly known as:  CLARITIN Take 10 mg by mouth daily as needed for allergies.   oxyCODONE 5 MG immediate release tablet Commonly known as:  Oxy IR/ROXICODONE Take 1-2 tablets (5-10 mg total) by mouth every 6 (six) hours as needed for severe pain (pain score 7-10).   rivaroxaban 10 MG Tabs tablet Commonly known as:  XARELTO Take 10 mg by mouth every evening.   traMADol 50  MG tablet Commonly known as:  ULTRAM Take 1 tablet (50 mg total) by mouth every 6 (six) hours as needed for moderate pain.          Management plans discussed with the patient and family and they are in agreement. Stable for discharge   Patient should follow up with ortho  CODE STATUS:     Code Status Orders  (From admission, onward)         Start     Ordered   11/26/18 1136  Full code  Continuous     11/26/18 1136        Code Status History    This patient has a current code status but no historical code status.      TOTAL TIME TAKING CARE OF THIS PATIENT: 38 minutes.    Note: This dictation was prepared with Dragon dictation along with smaller phrase technology. Any transcriptional errors that result from this process are unintentional.  Kaysa Roulhac M.D on 11/30/2018 at 10:01 AM  Between 7am to 6pm - Pager - [phone] After 6pm go to [email or web address] - password [email or web address] [facility]  Office  [phone]  CC: Primary care physician; Petra Kuba, MD

## 2018-11-30 LAB — BASIC METABOLIC PANEL
Anion gap: 8 (ref 5–15)
BUN: 15 mg/dL (ref 8–23)
CO2: 25 mmol/L (ref 22–32)
Calcium: 8 mg/dL — ABNORMAL LOW (ref 8.9–10.3)
Chloride: 104 mmol/L (ref 98–111)
Creatinine, Ser: 1.06 mg/dL — ABNORMAL HIGH (ref 0.44–1.00)
GFR calc Af Amer: 57 mL/min — ABNORMAL LOW (ref >60)
GFR calc non Af Amer: 50 mL/min — ABNORMAL LOW (ref >60)
Glucose, Bld: 113 mg/dL — ABNORMAL HIGH (ref 70–99)
POTASSIUM: 3.7 mmol/L (ref 3.5–5.1)
Sodium: 137 mmol/L (ref 135–145)

## 2018-11-30 LAB — CBC
HEMATOCRIT: 29.7 % — AB (ref 36.0–46.0)
HEMOGLOBIN: 9.3 g/dL — AB (ref 12.0–15.0)
MCH: 28.7 pg (ref 26.0–34.0)
MCHC: 31.3 g/dL (ref 30.0–36.0)
MCV: 91.7 fL (ref 80.0–100.0)
Platelets: 169 10*3/uL (ref 150–400)
RBC: 3.24 MIL/uL — ABNORMAL LOW (ref 3.87–5.11)
RDW: 13.6 % (ref 11.5–15.5)
WBC: 6.4 10*3/uL (ref 4.0–10.5)
nRBC: 0 % (ref 0.0–0.2)

## 2018-11-30 NOTE — Progress Notes (Signed)
Physical Therapy Treatment Patient Details Name: Cassandra Maxwell MRN: [id number] DOB: [birth year] Today's Date: 11/30/2018    History of Present Illness Pt. is an 82 y.o. female who was admitted to [facility] for ORIF repair of a Left Hip Fracture, and Left Proximal Humerus Fracture (nonsurgical) sustained during a fall on the stairs when feeding her dog.    PT Comments    Family would like to see pt in chair; patient now agreeable. Pt requires Mod A throughout with all sequencing cues. Pt with mild anxiety stating "I can't with stand, but with continued encouragement able to stand and stand pivot to the chair. Pt comfortable in chair. Continue PT to progress all functional mobility.   Follow Up Recommendations  SNF     Equipment Recommendations       Recommendations for Other Services       Precautions / Restrictions Precautions Precautions: Fall Restrictions Weight Bearing Restrictions: Yes LUE Weight Bearing: Non weight bearing LLE Weight Bearing: Weight bearing as tolerated    Mobility  Bed Mobility Overal bed mobility: Needs Assistance Bed Mobility: Supine to Sit     Supine to sit: Mod assist     General bed mobility comments: sequence cues and assist for trunk  Transfers Overall transfer level: Needs assistance Equipment used: Hemi-walker Transfers: Sit to/from [employer] Transfers Sit to Stand: Mod assist Stand pivot transfers: Mod assist       General transfer comment: 2 attempts with cues for sequence, posture, use of LLE   Ambulation/Gait             General Gait Details: unable to demonstrate taking steps   Stairs             Wheelchair Mobility    Modified Rankin (Stroke Patients Only)       Balance Overall balance assessment: Needs assistance Sitting-balance support: Feet supported;Single extremity supported Sitting balance-Leahy Scale: Fair   Postural control: Right lateral lean Standing balance support: Single extremity  supported Standing balance-Leahy Scale: Poor Standing balance comment: posterior lean                            Cognition Arousal/Alertness: Awake/alert Behavior During Therapy: WFL for tasks assessed/performed Overall Cognitive Status: Within Functional Limits for tasks assessed                                        Exercises General Exercises - Lower Extremity Ankle Circles/Pumps: AROM;Both;20 reps;Supine Quad Sets: Strengthening;Both;20 reps;Supine Gluteal Sets: Strengthening;Both;20 reps;Supine Short Arc Quad: AROM;Both;20 reps;Supine Long Arc Quad: Other (comment)(educated verbally) Heel Slides: AAROM;Both;20 reps;Supine Hip ABduction/ADduction: AAROM;Both;20 reps;Supine Straight Leg Raises: AAROM;Both;20 reps;Supine Other Exercises Other Exercises: pt/family provided with written HEP and education to assist pt    General Comments        Pertinent Vitals/Pain Pain Assessment: 0-10 Pain Score: 7  Pain Location: LUE("hurts bad") Pain Intervention(s): Monitored during session;Limited activity within patient's tolerance;Repositioned    Home Living                      Prior Function            PT Goals (current goals can now be found in the care plan section) Progress towards PT goals: Progressing toward goals    Frequency    BID      PT Plan  Current plan remains appropriate    Co-evaluation              AM-PAC PT "6 Clicks" Mobility   Outcome Measure  Help needed turning from your back to your side while in a flat bed without using bedrails?: A Lot Help needed moving from lying on your back to sitting on the side of a flat bed without using bedrails?: A Lot Help needed moving to and from a bed to a chair (including a wheelchair)?: A Lot Help needed standing up from a chair using your arms (e.g., wheelchair or bedside chair)?: A Lot Help needed to walk in hospital room?: Total Help needed climbing 3-5 steps  with a railing? : Total 6 Click Score: 10    End of Session Equipment Utilized During Treatment: Gait belt Activity Tolerance: Patient tolerated treatment well;Patient limited by pain;Patient limited by fatigue Patient left: in chair;with call bell/phone within reach;with chair alarm set;with family/visitor present   PT Visit Diagnosis: Muscle weakness (generalized) (M62.81);Difficulty in walking, not elsewhere classified (R26.2);Pain Pain - Right/Left: Left Pain - part of body: Hip;Shoulder     Time: 4888-9169 PT Time Calculation (min) (ACUTE ONLY): 13 min  Charges:  $Therapeutic Exercise: 23-37 mins $Therapeutic Activity: 8-22 mins                      Larae Grooms, PTA 11/30/2018, 3:28 PM

## 2018-11-30 NOTE — Progress Notes (Signed)
Patient is medically stable for D/C to [facility] today. [facility] Medicare SNF authorization has been received through [facility], authorization # [id number]. Per Otila Kluver [facility] liaison patient can come today to room 708. RN will call report and arrange EMS for transport. Clinical Education officer, museum (CSW) sent D/C orders to [facility] via [facility]. Patient is aware of above. Patient's daughters [name] and [name] are at bedside and aware of above. Please reconsult if future social work needs arise. CSW signing off.   [employer], LCSW [phone]

## 2018-11-30 NOTE — Anesthesia Postprocedure Evaluation (Signed)
Anesthesia Post Note  Patient: Cassandra Maxwell  Procedure(s) Performed: INTRAMEDULLARY (IM) NAIL INTERTROCHANTRIC (Left )  Patient location during evaluation: PACU Anesthesia Type: General Level of consciousness: awake and alert Pain management: pain level controlled Vital Signs Assessment: post-procedure vital signs reviewed and stable Respiratory status: spontaneous breathing, nonlabored ventilation, respiratory function stable and patient connected to nasal cannula oxygen Cardiovascular status: blood pressure returned to baseline and stable Postop Assessment: no apparent nausea or vomiting Anesthetic complications: no     Last Vitals:  Vitals:   11/30/18 0807 11/30/18 1214  BP: (!) 124/58 111/62  Pulse: (!) 59 65  Resp:    Temp: 36.6 C   SpO2: 97%     Last Pain:  Vitals:   11/30/18 1413  TempSrc:   PainSc: Why

## 2018-11-30 NOTE — Progress Notes (Signed)
Physical Therapy Treatment Patient Details Name: Cassandra Maxwell MRN: [id number] DOB: 04-10-1936 Today's Date: 11/30/2018    History of Present Illness Pt. is an 82 y.o. female who was admitted to [facility] for ORIF repair of a Left Hip Fracture, and Left Proximal Humerus Fracture (nonsurgical) sustained during a fall on the stairs when feeding her dog.    PT Comments    Pt agreeable to PT for bed exercises. Continues with LUE arm pain; leans R with pillow supporting LUE. Participates with supine bed exercises with assist as required. Declines further mobility at this time. Continue PT to progress participation, strength and endurance to improve functional mobility.    Follow Up Recommendations  SNF     Equipment Recommendations       Recommendations for Other Services       Precautions / Restrictions Precautions Precautions: Fall Restrictions Weight Bearing Restrictions: Yes LUE Weight Bearing: Non weight bearing LLE Weight Bearing: Weight bearing as tolerated    Mobility  Bed Mobility               General bed mobility comments: Pt declined  Transfers                    Ambulation/Gait                 Stairs             Wheelchair Mobility    Modified Rankin (Stroke Patients Only)       Balance                                            Cognition Arousal/Alertness: Awake/alert Behavior During Therapy: WFL for tasks assessed/performed Overall Cognitive Status: Within Functional Limits for tasks assessed                                        Exercises General Exercises - Lower Extremity Ankle Circles/Pumps: AROM;Both;20 reps;Supine Quad Sets: Strengthening;Both;20 reps;Supine Gluteal Sets: Strengthening;Both;20 reps;Supine Short Arc Quad: AROM;Both;20 reps;Supine Long Arc Quad: Other (comment)(educated verbally) Heel Slides: AAROM;Both;20 reps;Supine Hip ABduction/ADduction: AAROM;Both;20  reps;Supine Straight Leg Raises: AAROM;Both;20 reps;Supine Other Exercises Other Exercises: pt/family provided with written HEP and education to assist pt    General Comments        Pertinent Vitals/Pain Pain Assessment: 0-10 Pain Score: 7  Pain Location: LUE Pain Intervention(s): Monitored during session;Limited activity within patient's tolerance;Repositioned    Home Living                      Prior Function            PT Goals (current goals can now be found in the care plan section) Progress towards PT goals: Progressing toward goals(slowly)    Frequency    BID      PT Plan Current plan remains appropriate    Co-evaluation              AM-PAC PT "6 Clicks" Mobility   Outcome Measure  Help needed turning from your back to your side while in a flat bed without using bedrails?: Total Help needed moving from lying on your back to sitting on the side of a flat bed without using bedrails?: Total Help needed moving to  and from a bed to a chair (including a wheelchair)?: Total Help needed standing up from a chair using your arms (e.g., wheelchair or bedside chair)?: A Lot Help needed to walk in hospital room?: A Lot Help needed climbing 3-5 steps with a railing? : Total 6 Click Score: 8    End of Session   Activity Tolerance: Patient tolerated treatment well;Patient limited by fatigue;Patient limited by pain     PT Visit Diagnosis: Muscle weakness (generalized) (M62.81);Difficulty in walking, not elsewhere classified (R26.2);Pain Pain - Right/Left: Left Pain - part of body: Hip;Shoulder     Time: 1335-1400 PT Time Calculation (min) (ACUTE ONLY): 25 min  Charges:  $Therapeutic Exercise: 23-37 mins                      Larae Grooms, PTA 11/30/2018, 2:39 PM

## 2018-11-30 NOTE — Care Management Important Message (Signed)
Important Message  Patient Details  Name: Cassandra Maxwell MRN: [id number] Date of Birth: 03-Jun-1936   Medicare Important Message Given:  Yes    Juliann Pulse A Neko Mcgeehan 11/30/2018, 10:51 AM

## 2018-11-30 NOTE — Progress Notes (Signed)
Clinical Education officer, museum (CSW) contacted [employer] health this morning and was told that SNF authorization is still pending. CSW made [facility] aware that per MD patient is stable for D/C today. [facility] admissions coordinator is aware of above.   [employer], LCSW [phone]

## 2018-11-30 NOTE — Clinical Social Work Placement (Signed)
   CLINICAL SOCIAL WORK PLACEMENT  NOTE  Date:  11/30/2018  Patient Details  Name: Cassandra Maxwell MRN: [id number] Date of Birth: 08-08-1936  Clinical Social Work is seeking post-discharge placement for this patient at the [place] level of care (*CSW will initial, date and re-position this form in  chart as items are completed):  Yes   Patient/family provided with [place] Work Department's list of facilities offering this level of care within the geographic area requested by the patient (or if unable, by the patient's family).  Yes   Patient/family informed of their freedom to choose among providers that offer the needed level of care, that participate in Medicare, Medicaid or managed care program needed by the patient, have an available bed and are willing to accept the patient.  Yes   Patient/family informed of [facility]'s ownership interest in [facility] and [facility], as well as of the fact that they are under no obligation to receive care at these facilities.  PASRR submitted to EDS on 11/26/18     PASRR number received on 11/26/18     Existing PASRR number confirmed on       FL2 transmitted to all facilities in geographic area requested by pt/family on 11/27/18     FL2 transmitted to all facilities within larger geographic area on       Patient informed that his/her managed care company has contracts with or will negotiate with certain facilities, including the following:        Yes   Patient/family informed of bed offers received.  Patient chooses bed at ([facility] )     Physician recommends and patient chooses bed at      Patient to be transferred to ([facility] ) on 11/30/18.  Patient to be transferred to facility by [facility] )     Patient family notified on 11/30/18 of transfer.  Name of family member notified:  (Patient's daughters [name] and [name] are aware of D/C today. )     PHYSICIAN       Additional  Comment:    _______________________________________________ Takeo Harts, Veronia Beets, LCSW 11/30/2018, 4:08 PM

## 2018-11-30 NOTE — Progress Notes (Signed)
Physical Therapy Treatment Patient Details Name: Cassandra Maxwell MRN: [id number] DOB: 1936/05/04 Today's Date: 11/30/2018    History of Present Illness Pt. is an 82 y.o. female who was admitted to [facility] for ORIF repair of a Left Hip Fracture, and Left Proximal Humerus Fracture (nonsurgical) sustained during a fall on the stairs when feeding her dog.    PT Comments    Pt initially declines PT, but with gentle encouragement agreeable. Pain in LUE/shoulder 7/10. Repositioned on pillow for greater support. Pt participates with BLE supine bed exercises with assist as needed. Pt/family provided with written HEP and education on providing assist, technique, frequency and duration of all exercises. Continue PT to progress strength, endurance to improve all functional mobility.   Follow Up Recommendations  SNF     Equipment Recommendations       Recommendations for Other Services       Precautions / Restrictions Precautions Precautions: Fall Restrictions Weight Bearing Restrictions: Yes LUE Weight Bearing: Non weight bearing LLE Weight Bearing: Weight bearing as tolerated    Mobility  Bed Mobility               General bed mobility comments: Pt declined  Transfers                    Ambulation/Gait                 Stairs             Wheelchair Mobility    Modified Rankin (Stroke Patients Only)       Balance                                            Cognition Arousal/Alertness: Awake/alert Behavior During Therapy: WFL for tasks assessed/performed Overall Cognitive Status: Within Functional Limits for tasks assessed                                        Exercises General Exercises - Lower Extremity Ankle Circles/Pumps: AROM;Both;20 reps;Supine Quad Sets: Strengthening;Both;20 reps;Supine Gluteal Sets: Strengthening;Both;20 reps;Supine Short Arc Quad: AROM;Both;20 reps;Supine Long Arc Quad: Other  (comment)(educated verbally) Heel Slides: AAROM;Both;20 reps;Supine Hip ABduction/ADduction: AAROM;Both;20 reps;Supine Straight Leg Raises: AAROM;Both;20 reps;Supine Other Exercises Other Exercises: pt/family provided with written HEP and education to assist pt    General Comments        Pertinent Vitals/Pain Pain Assessment: 0-10 Pain Score: 7  Pain Location: LUE Pain Intervention(s): Monitored during session;Limited activity within patient's tolerance;Repositioned    Home Living                      Prior Function            PT Goals (current goals can now be found in the care plan section) Progress towards PT goals: Progressing toward goals(slowly)    Frequency    BID      PT Plan Current plan remains appropriate    Co-evaluation              AM-PAC PT "6 Clicks" Mobility   Outcome Measure  Help needed turning from your back to your side while in a flat bed without using bedrails?: Total Help needed moving from lying on your back to sitting on the side  of a flat bed without using bedrails?: Total Help needed moving to and from a bed to a chair (including a wheelchair)?: Total Help needed standing up from a chair using your arms (e.g., wheelchair or bedside chair)?: A Lot Help needed to walk in hospital room?: A Lot Help needed climbing 3-5 steps with a railing? : Total 6 Click Score: 8    End of Session   Activity Tolerance: Patient tolerated treatment well;Patient limited by fatigue;Patient limited by pain     PT Visit Diagnosis: Muscle weakness (generalized) (M62.81);Difficulty in walking, not elsewhere classified (R26.2);Pain Pain - Right/Left: Left Pain - part of body: Hip;Shoulder     Time: 1165-7903 PT Time Calculation (min) (ACUTE ONLY): 34 min  Charges:  $Therapeutic Exercise: 23-37 mins                      Larae Grooms, PTA 11/30/2018, 11:43 AM

## 2018-11-30 NOTE — Progress Notes (Signed)
Pt ready for discharge to SNF per MD. Pt assessment unchanged from this morning. Dressings changed to left hip per Vance Peper PA note. Report called to [facility]; all questions answered and discharge instructions reviewed. EMS transportation set up for pt by RN. PIV removed, VSS. Pt belongings packed. Family at bedside.  Cassandra Maxwell

## 2020-03-06 ENCOUNTER — Encounter: Payer: Self-pay | Admitting: Ophthalmology

## 2020-03-06 ENCOUNTER — Other Ambulatory Visit: Payer: Self-pay

## 2020-03-10 ENCOUNTER — Other Ambulatory Visit
Admission: RE | Admit: 2020-03-10 | Discharge: 2020-03-10 | Disposition: A | Discharge status: Home / Self Care | Payer: Medicare HMO | Source: Ambulatory Visit | Attending: Ophthalmology | Admitting: Ophthalmology

## 2020-03-10 ENCOUNTER — Other Ambulatory Visit: Payer: Self-pay

## 2020-03-10 DIAGNOSIS — Z01812 Encounter for preprocedural laboratory examination: Secondary | ICD-10-CM | POA: Insufficient documentation

## 2020-03-10 DIAGNOSIS — Z20822 Contact with and (suspected) exposure to covid-19: Secondary | ICD-10-CM | POA: Insufficient documentation

## 2020-03-11 LAB — SARS CORONAVIRUS 2 (TAT 6-24 HRS): SARS Coronavirus 2: NEGATIVE

## 2020-03-13 NOTE — Anesthesia Preprocedure Evaluation (Addendum)
Anesthesia Evaluation  Patient identified by MRN, date of birth, ID band Patient awake    Reviewed: Allergy & Precautions, NPO status , Patient's Chart, lab work & pertinent test results  History of Anesthesia Complications Negative for: history of anesthetic complications  Airway Mallampati: III  TM Distance: >3 FB Neck ROM: Full    Dental  (+) Upper Dentures, Lower Dentures   Pulmonary Current SmokerPatient did not abstain from smoking.,    breath sounds clear to auscultation       Cardiovascular hypertension, (-) angina(-) DOE  Rhythm:Regular Rate:Normal     Neuro/Psych  Dementia    GI/Hepatic neg GERD  ,  Endo/Other    Renal/GU      Musculoskeletal   Abdominal   Peds  Hematology  H/o blood clots, on Xarelto   Anesthesia Other Findings Breast cancer  Reproductive/Obstetrics                            Anesthesia Physical Anesthesia Plan  ASA: II  Anesthesia Plan: MAC   Post-op Pain Management:    Induction: Intravenous  PONV Risk Score and Plan: 2 and TIVA, Midazolam and Treatment may vary due to age or medical condition  Airway Management Planned: Nasal Cannula  Additional Equipment:   Intra-op Plan:   Post-operative Plan:   Informed Consent: I have reviewed the patients History and Physical, chart, labs and discussed the procedure including the risks, benefits and alternatives for the proposed anesthesia with the patient or authorized representative who has indicated his/her understanding and acceptance.       Plan Discussed with: CRNA and Anesthesiologist  Anesthesia Plan Comments:         Anesthesia Quick Evaluation

## 2020-03-13 NOTE — Discharge Instructions (Signed)

## 2020-03-14 ENCOUNTER — Ambulatory Visit
Admission: RE | Admit: 2020-03-14 | Discharge: 2020-03-14 | Disposition: A | Discharge status: Home / Self Care | Payer: Medicare HMO | Attending: Ophthalmology | Admitting: Ophthalmology

## 2020-03-14 ENCOUNTER — Ambulatory Visit: Payer: Medicare HMO | Admitting: Anesthesiology

## 2020-03-14 ENCOUNTER — Other Ambulatory Visit: Payer: Self-pay

## 2020-03-14 ENCOUNTER — Encounter: Payer: Self-pay | Admitting: Ophthalmology

## 2020-03-14 ENCOUNTER — Encounter
Admission: RE | Disposition: A | Discharge status: Home / Self Care | Payer: Self-pay | Source: Home / Self Care | Attending: Ophthalmology

## 2020-03-14 DIAGNOSIS — Z853 Personal history of malignant neoplasm of breast: Secondary | ICD-10-CM | POA: Diagnosis not present

## 2020-03-14 DIAGNOSIS — Z86718 Personal history of other venous thrombosis and embolism: Secondary | ICD-10-CM | POA: Diagnosis not present

## 2020-03-14 DIAGNOSIS — H2511 Age-related nuclear cataract, right eye: Secondary | ICD-10-CM | POA: Insufficient documentation

## 2020-03-14 DIAGNOSIS — F0390 Unspecified dementia without behavioral disturbance: Secondary | ICD-10-CM | POA: Insufficient documentation

## 2020-03-14 DIAGNOSIS — I10 Essential (primary) hypertension: Secondary | ICD-10-CM | POA: Diagnosis not present

## 2020-03-14 DIAGNOSIS — Z7901 Long term (current) use of anticoagulants: Secondary | ICD-10-CM | POA: Diagnosis not present

## 2020-03-14 HISTORY — DX: Essential (primary) hypertension: I10

## 2020-03-14 HISTORY — DX: Other amnesia: R41.3

## 2020-03-14 HISTORY — DX: Personal history of other venous thrombosis and embolism: Z86.718

## 2020-03-14 HISTORY — DX: Presence of dental prosthetic device (complete) (partial): Z97.2

## 2020-03-14 HISTORY — PX: CATARACT EXTRACTION W/PHACO: SHX586

## 2020-03-14 SURGERY — PHACOEMULSIFICATION, CATARACT, WITH IOL INSERTION
Anesthesia: Monitor Anesthesia Care | Site: Eye | Laterality: Right

## 2020-03-14 MED ORDER — EPINEPHRINE PF 1 MG/ML IJ SOLN
INTRAOCULAR | Status: DC | PRN
  Administered 2020-03-14: 12:00:00 99 mL via OPHTHALMIC

## 2020-03-14 MED ORDER — LACTATED RINGERS IV SOLN: 100.0000 mL/h | INTRAVENOUS | Status: DC

## 2020-03-14 MED ORDER — ARMC OPHTHALMIC DILATING DROPS
1.0000 "application " | OPHTHALMIC | Status: DC | PRN
  Administered 2020-03-14 (×3): 1 via OPHTHALMIC

## 2020-03-14 MED ORDER — FENTANYL CITRATE (PF) 100 MCG/2ML IJ SOLN
INTRAMUSCULAR | Status: DC | PRN
  Administered 2020-03-14: 50 ug via INTRAVENOUS

## 2020-03-14 MED ORDER — TETRACAINE HCL 0.5 % OP SOLN
1.0000 [drp] | OPHTHALMIC | Status: DC | PRN
  Administered 2020-03-14 (×3): 1 [drp] via OPHTHALMIC

## 2020-03-14 MED ORDER — BRIMONIDINE TARTRATE-TIMOLOL 0.2-0.5 % OP SOLN
OPHTHALMIC | Status: DC | PRN
  Administered 2020-03-14: 1 [drp] via OPHTHALMIC

## 2020-03-14 MED ORDER — NA CHONDROIT SULF-NA HYALURON 40-17 MG/ML IO SOLN
INTRAOCULAR | Status: DC | PRN
  Administered 2020-03-14: 1 mL via INTRAOCULAR

## 2020-03-14 MED ORDER — LIDOCAINE HCL (PF) 2 % IJ SOLN
INTRAOCULAR | Status: DC | PRN
  Administered 2020-03-14: 2 mL

## 2020-03-14 MED ORDER — ACETAMINOPHEN 10 MG/ML IV SOLN: 1000.0000 mg | Freq: Once | INTRAVENOUS | Status: DC | PRN

## 2020-03-14 MED ORDER — MOXIFLOXACIN HCL 0.5 % OP SOLN
OPHTHALMIC | Status: DC | PRN
  Administered 2020-03-14: 0.2 mL via OPHTHALMIC

## 2020-03-14 MED ORDER — ONDANSETRON HCL 4 MG/2ML IJ SOLN: 4.0000 mg | Freq: Once | INTRAMUSCULAR | Status: DC | PRN

## 2020-03-14 SURGICAL SUPPLY — 18 items
CANNULA ANT/CHMB 27GA (MISCELLANEOUS) ×6 IMPLANT
DISSECTOR HYDRO NUCLEUS 50X22 (MISCELLANEOUS) ×3 IMPLANT
GLOVE SURG LX 8.0 MICRO (GLOVE) ×2
GLOVE SURG LX STRL 8.0 MICRO (GLOVE) ×1 IMPLANT
GLOVE SURG TRIUMPH 8.0 PF LTX (GLOVE) ×3 IMPLANT
GOWN STRL REUS W/ TWL LRG LVL3 (GOWN DISPOSABLE) ×2 IMPLANT
GOWN STRL REUS W/TWL LRG LVL3 (GOWN DISPOSABLE) ×4
LENS IOL TECNIS ITEC 21.0 (Intraocular Lens) ×3 IMPLANT
MARKER SKIN DUAL TIP RULER LAB (MISCELLANEOUS) ×3 IMPLANT
NEEDLE FILTER BLUNT 18X 1/2SAF (NEEDLE) ×2
NEEDLE FILTER BLUNT 18X1 1/2 (NEEDLE) ×1 IMPLANT
PACK EYE AFTER SURG (MISCELLANEOUS) ×3 IMPLANT
PACK OPTHALMIC (MISCELLANEOUS) ×3 IMPLANT
PACK PORFILIO (MISCELLANEOUS) ×3 IMPLANT
SYR 3ML LL SCALE MARK (SYRINGE) ×3 IMPLANT
SYR TB 1ML LUER SLIP (SYRINGE) ×3 IMPLANT
WATER STERILE IRR 250ML POUR (IV SOLUTION) ×3 IMPLANT
WIPE NON LINTING 3.25X3.25 (MISCELLANEOUS) ×3 IMPLANT

## 2020-03-14 NOTE — Op Note (Signed)
PREOPERATIVE DIAGNOSIS:  Nuclear sclerotic cataract of the right eye.   POSTOPERATIVE DIAGNOSIS:  H25.11 Cataract   OPERATIVE PROCEDURE:@   SURGEON:  Birder Robson, MD.   ANESTHESIA:  Anesthesiologist: Heniser, Fredric Dine, MD CRNA: Silvana Newness, CRNA  1.      Managed anesthesia care. 2.      0.47ml of Shugarcaine was instilled in the eye following the paracentesis.   COMPLICATIONS:  None.   TECHNIQUE:   Stop and chop   DESCRIPTION OF PROCEDURE:  The patient was examined and consented in the preoperative holding area where the aforementioned topical anesthesia was applied to the right eye and then brought back to the Operating Room where the right eye was prepped and draped in the usual sterile ophthalmic fashion and a lid speculum was placed. A paracentesis was created with the side port blade and the anterior chamber was filled with viscoelastic. A near clear corneal incision was performed with the steel keratome. A continuous curvilinear capsulorrhexis was performed with a cystotome followed by the capsulorrhexis forceps. Hydrodissection and hydrodelineation were carried out with BSS on a blunt cannula. The lens was removed in a stop and chop  technique and the remaining cortical material was removed with the irrigation-aspiration handpiece. The capsular bag was inflated with viscoelastic and the Technis [id number]  lens was placed in the capsular bag without complication. The remaining viscoelastic was removed from the eye with the irrigation-aspiration handpiece. The wounds were hydrated. The anterior chamber was flushed with BSS and the eye was inflated to physiologic pressure. 0.71ml of Vigamox was placed in the anterior chamber. The wounds were found to be water tight. The eye was dressed with Combigan. The patient was given protective glasses to wear throughout the day and a shield with which to sleep tonight. The patient was also given drops with which to begin a drop regimen today and will  follow-up with me in one day. Implant Name Type Inv. Item Serial No. Manufacturer Lot No. LRB No. Used Action  LENS IOL DIOP 21.0 - [id number] Intraocular Lens LENS IOL DIOP 21.0 [id number]  Right 1 Implanted   Procedure(s): CATARACT EXTRACTION PHACO AND INTRAOCULAR LENS PLACEMENT (IOC) RIGHT 26.78 02:11.7 (Right)  Electronically signed: Birder Robson 03/14/2020 12:00 PM

## 2020-03-14 NOTE — H&P (Signed)
All labs reviewed. Abnormal studies sent to patients PCP when indicated.  Previous H&P reviewed, patient examined, there are NO CHANGES.  Cassandra Maxwell Porfilio3/30/202111:31 AM

## 2020-03-14 NOTE — Transfer of Care (Signed)
Immediate Anesthesia Transfer of Care Note  Patient: Cassandra Maxwell  Procedure(s) Performed: CATARACT EXTRACTION PHACO AND INTRAOCULAR LENS PLACEMENT (IOC) RIGHT (Right Eye)  Patient Location: PACU  Anesthesia Type: MAC  Level of Consciousness: awake, alert  and patient cooperative  Airway and Oxygen Therapy: Patient Spontanous Breathing and Patient connected to supplemental oxygen  Post-op Assessment: Post-op Vital signs reviewed, Patient's Cardiovascular Status Stable, Respiratory Function Stable, Patent Airway and No signs of Nausea or vomiting  Post-op Vital Signs: Reviewed and stable  Complications: No apparent anesthesia complications

## 2020-03-14 NOTE — Anesthesia Postprocedure Evaluation (Signed)
Anesthesia Post Note  Patient: Cassandra Maxwell  Procedure(s) Performed: CATARACT EXTRACTION PHACO AND INTRAOCULAR LENS PLACEMENT (IOC) RIGHT 26.78 02:11.7 (Right Eye)     Patient location during evaluation: PACU Anesthesia Type: MAC Level of consciousness: awake and alert Pain management: pain level controlled Vital Signs Assessment: post-procedure vital signs reviewed and stable Respiratory status: spontaneous breathing, nonlabored ventilation, respiratory function stable and patient connected to nasal cannula oxygen Cardiovascular status: stable and blood pressure returned to baseline Postop Assessment: no apparent nausea or vomiting Anesthetic complications: no    Najia Hurlbutt A  Jairo Bellew

## 2020-03-15 ENCOUNTER — Encounter: Payer: Self-pay | Admitting: *Deleted

## 2020-03-28 NOTE — Anesthesia Preprocedure Evaluation (Addendum)
Anesthesia Evaluation  Patient identified by MRN, date of birth, ID band Patient awake    Reviewed: Allergy & Precautions, NPO status , Patient's Chart, lab work & pertinent test results, reviewed documented beta blocker date and time   History of Anesthesia Complications Negative for: history of anesthetic complications  Airway Mallampati: III  TM Distance: >3 FB Neck ROM: Full    Dental  (+) Upper Dentures, Lower Dentures   Pulmonary Current SmokerPatient did not abstain from smoking.,    breath sounds clear to auscultation       Cardiovascular hypertension, (-) angina(-) DOE  Rhythm:Regular Rate:Normal     Neuro/Psych  Dementia    GI/Hepatic neg GERD  ,  Endo/Other    Renal/GU      Musculoskeletal   Abdominal   Peds  Hematology  H/o blood clots, on Xarelto   Anesthesia Other Findings Breast cancer  Reproductive/Obstetrics                            Anesthesia Physical  Anesthesia Plan  ASA: II  Anesthesia Plan: MAC   Post-op Pain Management:    Induction: Intravenous  PONV Risk Score and Plan: 2 and TIVA, Midazolam and Treatment may vary due to age or medical condition  Airway Management Planned: Nasal Cannula  Additional Equipment:   Intra-op Plan:   Post-operative Plan:   Informed Consent: I have reviewed the patients History and Physical, chart, labs and discussed the procedure including the risks, benefits and alternatives for the proposed anesthesia with the patient or authorized representative who has indicated his/her understanding and acceptance.       Plan Discussed with: CRNA and Anesthesiologist  Anesthesia Plan Comments:         Anesthesia Quick Evaluation

## 2020-03-29 ENCOUNTER — Encounter: Payer: Self-pay | Admitting: Ophthalmology

## 2020-03-29 ENCOUNTER — Other Ambulatory Visit: Payer: Self-pay

## 2020-03-31 ENCOUNTER — Other Ambulatory Visit
Admission: RE | Admit: 2020-03-31 | Discharge: 2020-03-31 | Disposition: A | Discharge status: Home / Self Care | Payer: Medicare HMO | Source: Ambulatory Visit | Attending: Ophthalmology | Admitting: Ophthalmology

## 2020-03-31 DIAGNOSIS — Z01812 Encounter for preprocedural laboratory examination: Secondary | ICD-10-CM | POA: Diagnosis present

## 2020-03-31 DIAGNOSIS — Z20822 Contact with and (suspected) exposure to covid-19: Secondary | ICD-10-CM | POA: Insufficient documentation

## 2020-03-31 LAB — SARS CORONAVIRUS 2 (TAT 6-24 HRS): SARS Coronavirus 2: NEGATIVE

## 2020-03-31 NOTE — Discharge Instructions (Signed)

## 2020-04-04 ENCOUNTER — Encounter: Payer: Self-pay | Admitting: Ophthalmology

## 2020-04-04 ENCOUNTER — Other Ambulatory Visit: Payer: Self-pay

## 2020-04-04 ENCOUNTER — Ambulatory Visit: Payer: Medicare HMO | Admitting: Anesthesiology

## 2020-04-04 ENCOUNTER — Ambulatory Visit
Admission: RE | Admit: 2020-04-04 | Discharge: 2020-04-04 | Disposition: A | Discharge status: Home / Self Care | Payer: Medicare HMO | Attending: Ophthalmology | Admitting: Ophthalmology

## 2020-04-04 ENCOUNTER — Encounter
Admission: RE | Disposition: A | Discharge status: Home / Self Care | Payer: Self-pay | Source: Home / Self Care | Attending: Ophthalmology

## 2020-04-04 DIAGNOSIS — H2512 Age-related nuclear cataract, left eye: Secondary | ICD-10-CM | POA: Diagnosis not present

## 2020-04-04 DIAGNOSIS — Z96649 Presence of unspecified artificial hip joint: Secondary | ICD-10-CM | POA: Diagnosis not present

## 2020-04-04 DIAGNOSIS — F0390 Unspecified dementia without behavioral disturbance: Secondary | ICD-10-CM | POA: Diagnosis not present

## 2020-04-04 DIAGNOSIS — F17200 Nicotine dependence, unspecified, uncomplicated: Secondary | ICD-10-CM | POA: Insufficient documentation

## 2020-04-04 DIAGNOSIS — Z853 Personal history of malignant neoplasm of breast: Secondary | ICD-10-CM | POA: Insufficient documentation

## 2020-04-04 DIAGNOSIS — Z8679 Personal history of other diseases of the circulatory system: Secondary | ICD-10-CM | POA: Diagnosis not present

## 2020-04-04 DIAGNOSIS — Z7901 Long term (current) use of anticoagulants: Secondary | ICD-10-CM | POA: Insufficient documentation

## 2020-04-04 DIAGNOSIS — Z79899 Other long term (current) drug therapy: Secondary | ICD-10-CM | POA: Diagnosis not present

## 2020-04-04 HISTORY — PX: CATARACT EXTRACTION W/PHACO: SHX586

## 2020-04-04 SURGERY — PHACOEMULSIFICATION, CATARACT, WITH IOL INSERTION
Anesthesia: Monitor Anesthesia Care | Site: Eye | Laterality: Left

## 2020-04-04 MED ORDER — LACTATED RINGERS IV SOLN: 100.0000 mL/h | INTRAVENOUS | Status: DC

## 2020-04-04 MED ORDER — FENTANYL CITRATE (PF) 100 MCG/2ML IJ SOLN
INTRAMUSCULAR | Status: DC | PRN
  Administered 2020-04-04 (×4): 25 ug via INTRAVENOUS

## 2020-04-04 MED ORDER — ARMC OPHTHALMIC DILATING DROPS
1.0000 "application " | OPHTHALMIC | Status: DC | PRN
  Administered 2020-04-04 (×3): 1 via OPHTHALMIC

## 2020-04-04 MED ORDER — MOXIFLOXACIN HCL 0.5 % OP SOLN
OPHTHALMIC | Status: DC | PRN
  Administered 2020-04-04: 0.2 mL via OPHTHALMIC

## 2020-04-04 MED ORDER — LIDOCAINE HCL (PF) 2 % IJ SOLN
INTRAOCULAR | Status: DC | PRN
  Administered 2020-04-04: 2 mL

## 2020-04-04 MED ORDER — BRIMONIDINE TARTRATE-TIMOLOL 0.2-0.5 % OP SOLN
OPHTHALMIC | Status: DC | PRN
  Administered 2020-04-04: 1 [drp] via OPHTHALMIC

## 2020-04-04 MED ORDER — TETRACAINE HCL 0.5 % OP SOLN
1.0000 [drp] | OPHTHALMIC | Status: DC | PRN
  Administered 2020-04-04 (×3): 1 [drp] via OPHTHALMIC

## 2020-04-04 MED ORDER — EPINEPHRINE PF 1 MG/ML IJ SOLN
INTRAOCULAR | Status: DC | PRN
  Administered 2020-04-04: 12:00:00 71 mL via OPHTHALMIC

## 2020-04-04 MED ORDER — NA CHONDROIT SULF-NA HYALURON 40-17 MG/ML IO SOLN
INTRAOCULAR | Status: DC | PRN
  Administered 2020-04-04: 1 mL via INTRAOCULAR

## 2020-04-04 SURGICAL SUPPLY — 21 items
CANNULA ANT/CHMB 27G (MISCELLANEOUS) ×2 IMPLANT
CANNULA ANT/CHMB 27GA (MISCELLANEOUS) ×6 IMPLANT
DISSECTOR HYDRO NUCLEUS 50X22 (MISCELLANEOUS) ×3 IMPLANT
GLOVE SURG LX 8.0 MICRO (GLOVE) ×2
GLOVE SURG LX STRL 8.0 MICRO (GLOVE) ×1 IMPLANT
GLOVE SURG TRIUMPH 8.0 PF LTX (GLOVE) ×3 IMPLANT
GOWN STRL REUS W/ TWL LRG LVL3 (GOWN DISPOSABLE) ×2 IMPLANT
GOWN STRL REUS W/TWL LRG LVL3 (GOWN DISPOSABLE) ×4
LENS IOL DIOP 21.5 (Intraocular Lens) ×3 IMPLANT
LENS IOL TECNIS MONO 21.5 (Intraocular Lens) IMPLANT
MARKER SKIN DUAL TIP RULER LAB (MISCELLANEOUS) ×3 IMPLANT
NDL FILTER BLUNT 18X1 1/2 (NEEDLE) ×1 IMPLANT
NEEDLE FILTER BLUNT 18X 1/2SAF (NEEDLE) ×2
NEEDLE FILTER BLUNT 18X1 1/2 (NEEDLE) ×1 IMPLANT
PACK EYE AFTER SURG (MISCELLANEOUS) ×3 IMPLANT
PACK OPTHALMIC (MISCELLANEOUS) ×3 IMPLANT
PACK PORFILIO (MISCELLANEOUS) ×3 IMPLANT
SYR 3ML LL SCALE MARK (SYRINGE) ×3 IMPLANT
SYR TB 1ML LUER SLIP (SYRINGE) ×3 IMPLANT
WATER STERILE IRR 250ML POUR (IV SOLUTION) ×3 IMPLANT
WIPE NON LINTING 3.25X3.25 (MISCELLANEOUS) ×3 IMPLANT

## 2020-04-04 NOTE — Transfer of Care (Signed)
Immediate Anesthesia Transfer of Care Note  Patient: Cassandra Maxwell  Procedure(s) Performed: CATARACT EXTRACTION PHACO AND INTRAOCULAR LENS PLACEMENT (IOC) Left 16.30 01:21.4 (Left Eye)  Patient Location: PACU  Anesthesia Type: MAC  Level of Consciousness: awake, alert  and patient cooperative  Airway and Oxygen Therapy: Patient Spontanous Breathing and Patient connected to supplemental oxygen  Post-op Assessment: Post-op Vital signs reviewed, Patient's Cardiovascular Status Stable, Respiratory Function Stable, Patent Airway and No signs of Nausea or vomiting  Post-op Vital Signs: Reviewed and stable  Complications: No apparent anesthesia complications

## 2020-04-04 NOTE — Op Note (Signed)
PREOPERATIVE DIAGNOSIS:  Nuclear sclerotic cataract of the left eye.   POSTOPERATIVE DIAGNOSIS:  Nuclear sclerotic cataract of the left eye.   OPERATIVE PROCEDURE:@   SURGEON:  Birder Robson, MD.   ANESTHESIA:  Anesthesiologist: Heniser, Fredric Dine, MD CRNA: Jeannene Patella, CRNA  1.      Managed anesthesia care. 2.     0.14ml of Shugarcaine was instilled following the paracentesis   COMPLICATIONS:  None.   TECHNIQUE:   Stop and chop   DESCRIPTION OF PROCEDURE:  The patient was examined and consented in the preoperative holding area where the aforementioned topical anesthesia was applied to the left eye and then brought back to the Operating Room where the left eye was prepped and draped in the usual sterile ophthalmic fashion and a lid speculum was placed. A paracentesis was created with the side port blade and the anterior chamber was filled with viscoelastic. A near clear corneal incision was performed with the steel keratome. A continuous curvilinear capsulorrhexis was performed with a cystotome followed by the capsulorrhexis forceps. Hydrodissection and hydrodelineation were carried out with BSS on a blunt cannula. The lens was removed in a stop and chop  technique and the remaining cortical material was removed with the irrigation-aspiration handpiece. The capsular bag was inflated with viscoelastic and the Technis [id number] lens was placed in the capsular bag without complication. The remaining viscoelastic was removed from the eye with the irrigation-aspiration handpiece. The wounds were hydrated. The anterior chamber was flushed with BSS and the eye was inflated to physiologic pressure. 0.36ml Vigamox was placed in the anterior chamber. The wounds were found to be water tight. The eye was dressed with Combigan. The patient was given protective glasses to wear throughout the day and a shield with which to sleep tonight. The patient was also given drops with which to begin a drop regimen  today and will follow-up with me in one day. Implant Name Type Inv. Item Serial No. Manufacturer Lot No. LRB No. Used Action  LENS IOL DIOP 21.5 - [id number] Intraocular Lens LENS IOL DIOP 21.5 [id number]  Left 1 Implanted    Procedure(s): CATARACT EXTRACTION PHACO AND INTRAOCULAR LENS PLACEMENT (IOC) Left 16.30 01:21.4 (Left)  Electronically signed: Birder Robson 04/04/2020 12:06 PM

## 2020-04-04 NOTE — H&P (Signed)
All labs reviewed. Abnormal studies sent to patients PCP when indicated.  Previous H&P reviewed, patient examined, there are NO CHANGES.  Cassandra Balis Porfilio4/20/202111:22 AM

## 2020-04-04 NOTE — Anesthesia Postprocedure Evaluation (Signed)
Anesthesia Post Note  Patient: Cassandra Maxwell  Procedure(s) Performed: CATARACT EXTRACTION PHACO AND INTRAOCULAR LENS PLACEMENT (IOC) Left 16.30 01:21.4 (Left Eye)     Patient location during evaluation: PACU Anesthesia Type: MAC Level of consciousness: awake and alert Pain management: pain level controlled Vital Signs Assessment: post-procedure vital signs reviewed and stable Respiratory status: spontaneous breathing, nonlabored ventilation, respiratory function stable and patient connected to nasal cannula oxygen Cardiovascular status: stable and blood pressure returned to baseline Postop Assessment: no apparent nausea or vomiting Anesthetic complications: no    Lyra Alaimo A  Corian Handley

## 2020-05-26 IMAGING — CT CT HEAD W/O CM
3 of 4 series · 15 of 47 positions shown, 18 images · non-contrast
Comparison: None.

CLINICAL DATA: Head trauma

EXAM:
CT HEAD WITHOUT CONTRAST
TECHNIQUE: Contiguous axial images were obtained from the base of the skull
through the vertex without intravenous contrast.

[Series 3: head wo · axial · 0.41mm/px · z∈[-66,+59]mm · 9 of 29 slices shown, 12 images]
[im 2/29  brain]
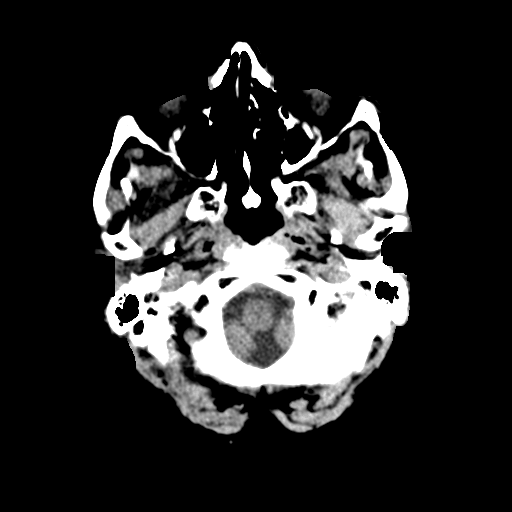
[im 2/29  bone]
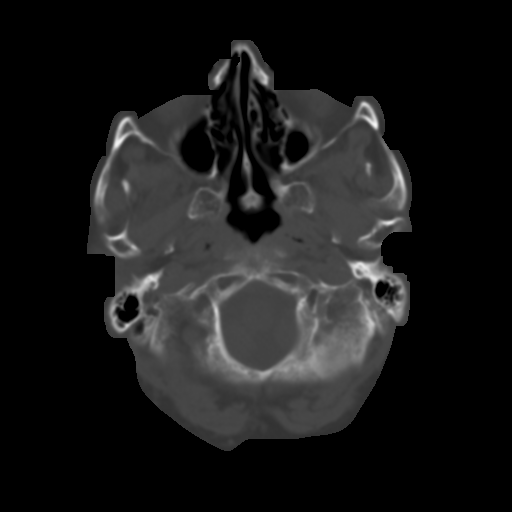
[im 6/29  brain]
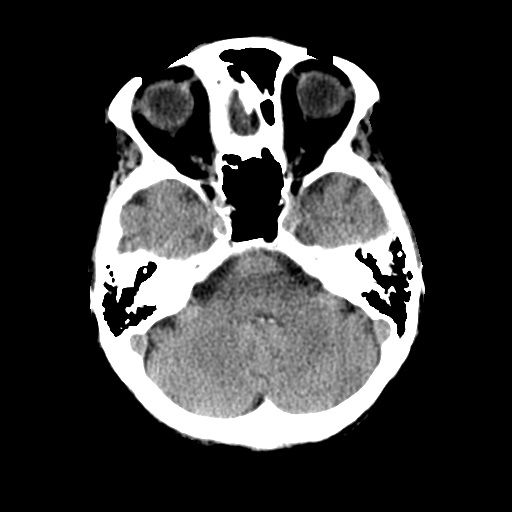
[im 8/29  brain]
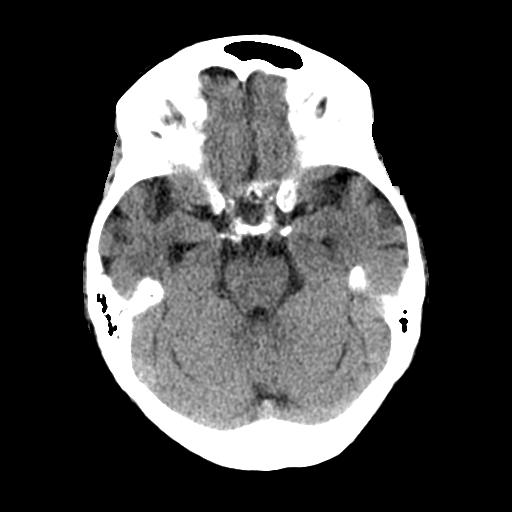
[im 12/29  brain]
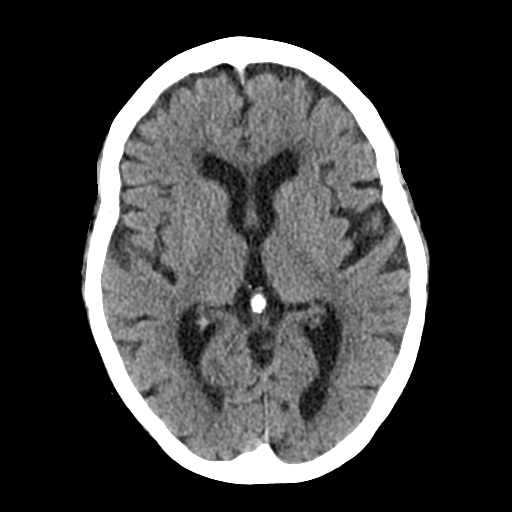
[im 15/29  brain]
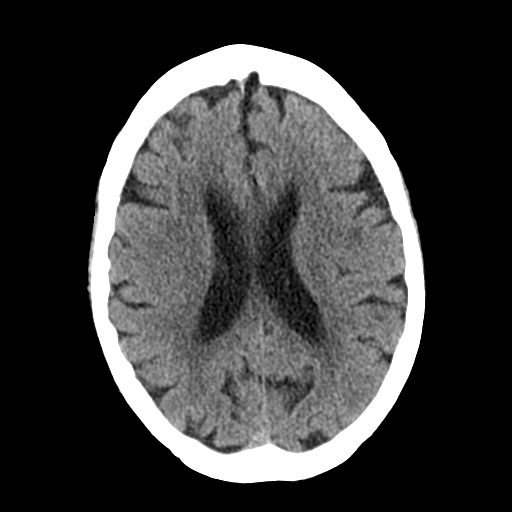
[im 15/29  bone]
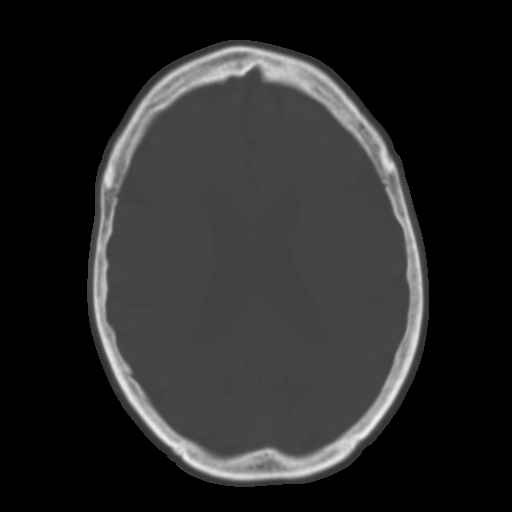
[im 17/29  brain]
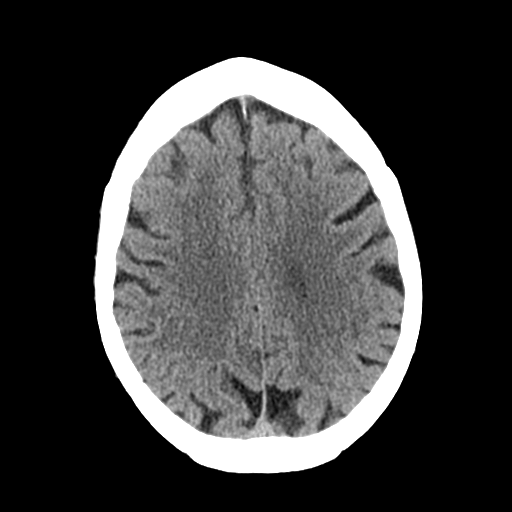
[im 21/29  brain]
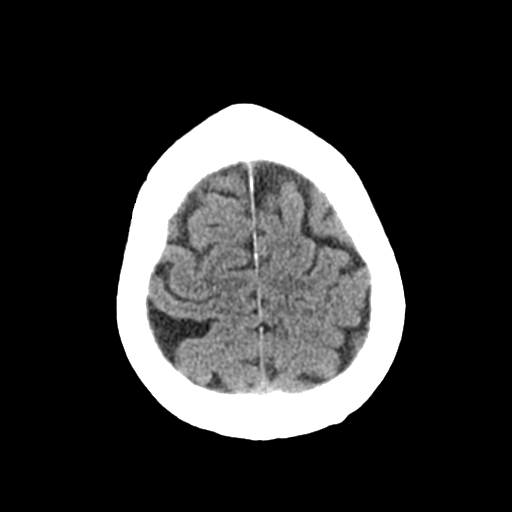
[im 23/29  brain]
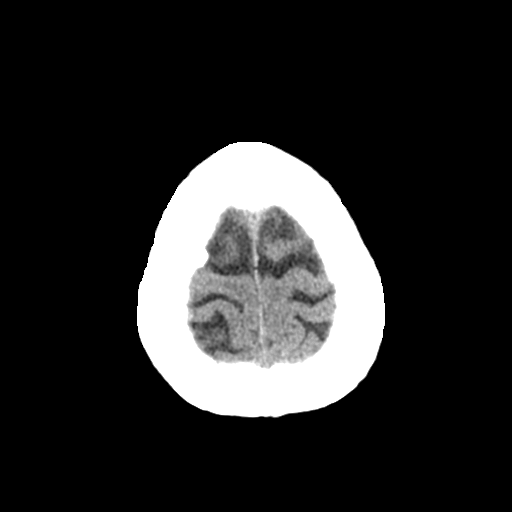
[im 27/29  brain]
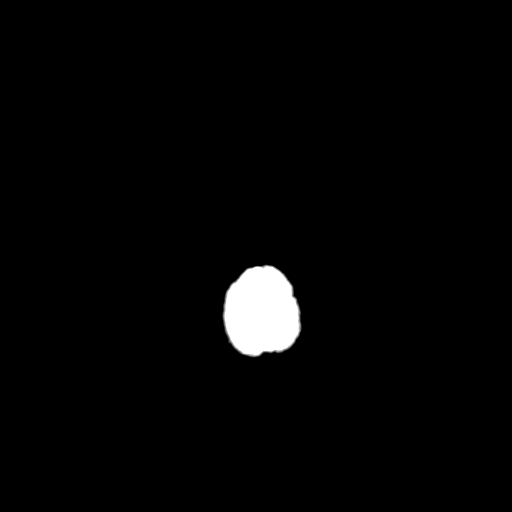
[im 27/29  bone]
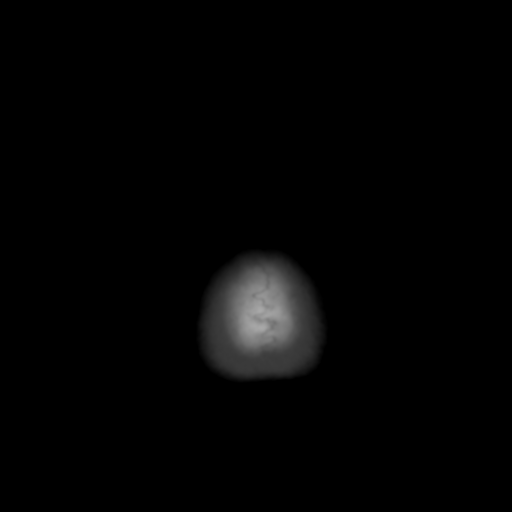

[Series 4: coronal soft tissue · coronal · 0.30mm/px · 3 of 60 slices shown]
[im 20/60  brain]
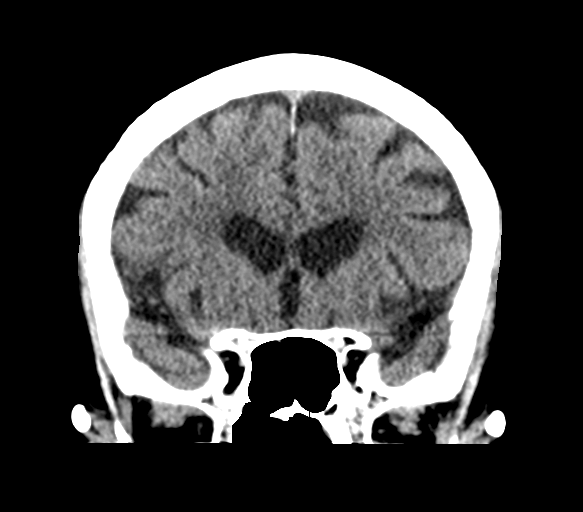
[im 27/60  brain]
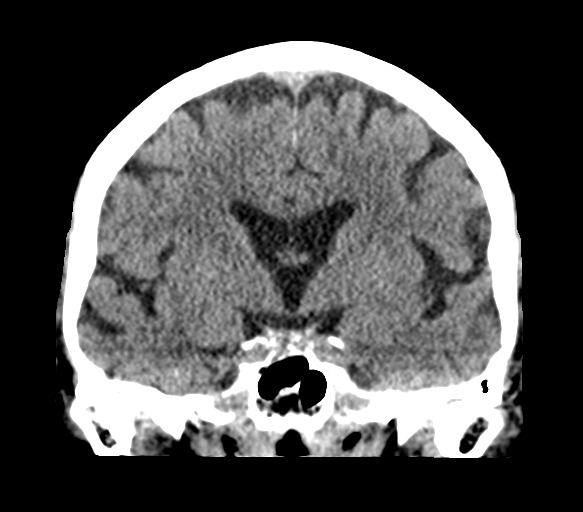
[im 33/60  brain]
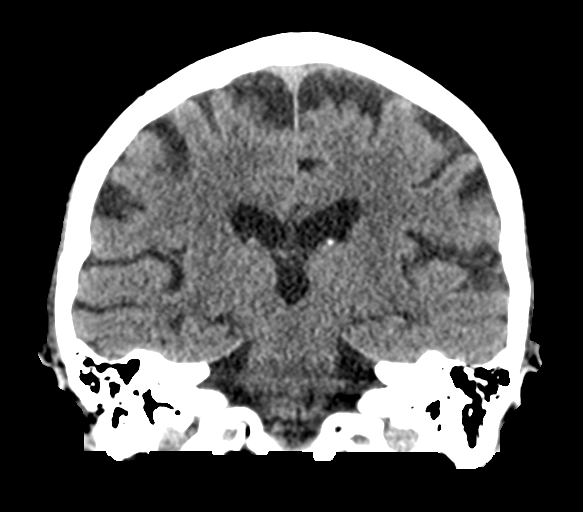

[Series 5: sagittal soft tissue · sagittal · 0.29mm/px · 3 of 50 slices shown]
[im 17/50  brain]
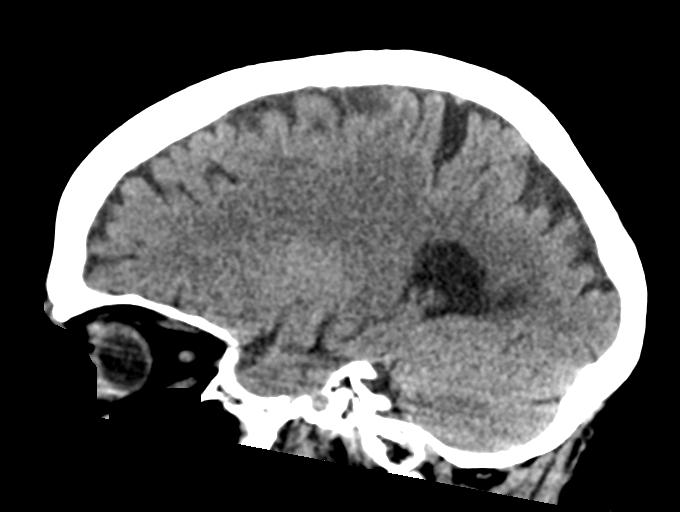
[im 25/50  brain]
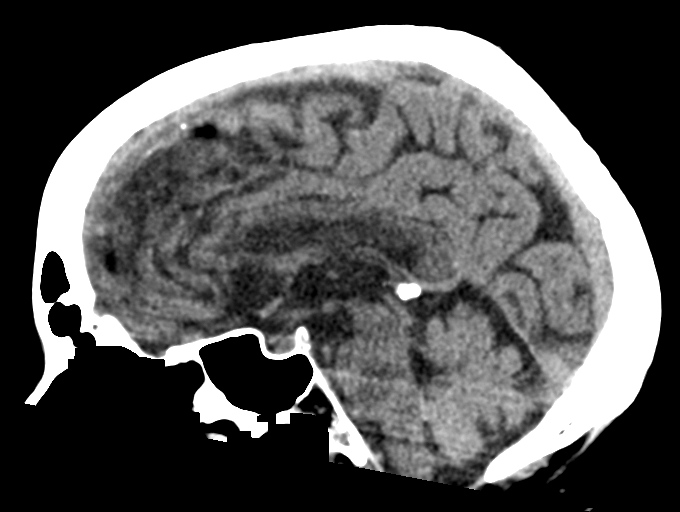
[im 33/50  brain]
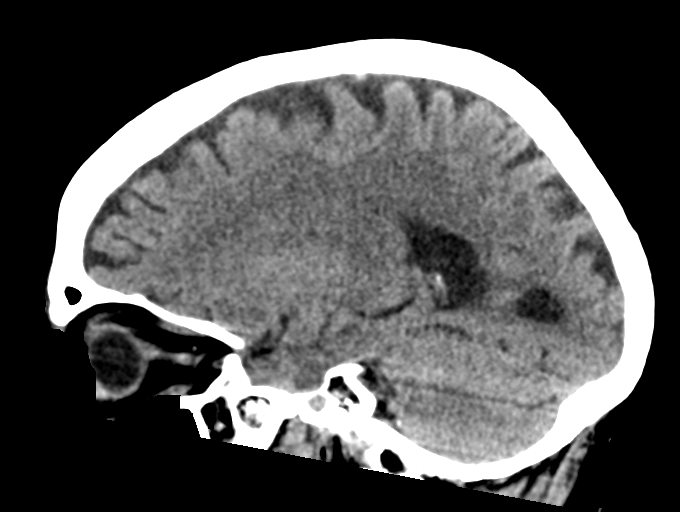

[15 of 47 positions shown; findings below may reference images not displayed]

FINDINGS: Brain: No evidence of acute infarction, hemorrhage, extra-axial
collection, ventriculomegaly, or mass effect. Generalized cerebral
atrophy. Periventricular white matter low attenuation likely
secondary to microangiopathy.

Vascular: Cerebrovascular atherosclerotic calcifications are noted.

Skull: Negative for fracture or focal lesion.

Sinuses/Orbits: Visualized portions of the orbits are unremarkable.
Visualized portions of the paranasal sinuses and mastoid air cells
are unremarkable.

Other: None.
IMPRESSION: No acute intracranial pathology.

## 2020-05-26 IMAGING — XA DG HIP (WITH PELVIS) OPERATIVE*L*
5 series · 5 of 5 positions shown · non-contrast
Comparison: 11/26/2018

CLINICAL DATA: Hip fracture

EXAM:
OPERATIVE left HIP (WITH PELVIS IF PERFORMED) 5 VIEWS
TECHNIQUE: Fluoroscopic spot image(s) were submitted for interpretation
post-operatively.

[Series 9: cont. · 1 of 1 slices shown (1 of 5)]
[im 1/1]
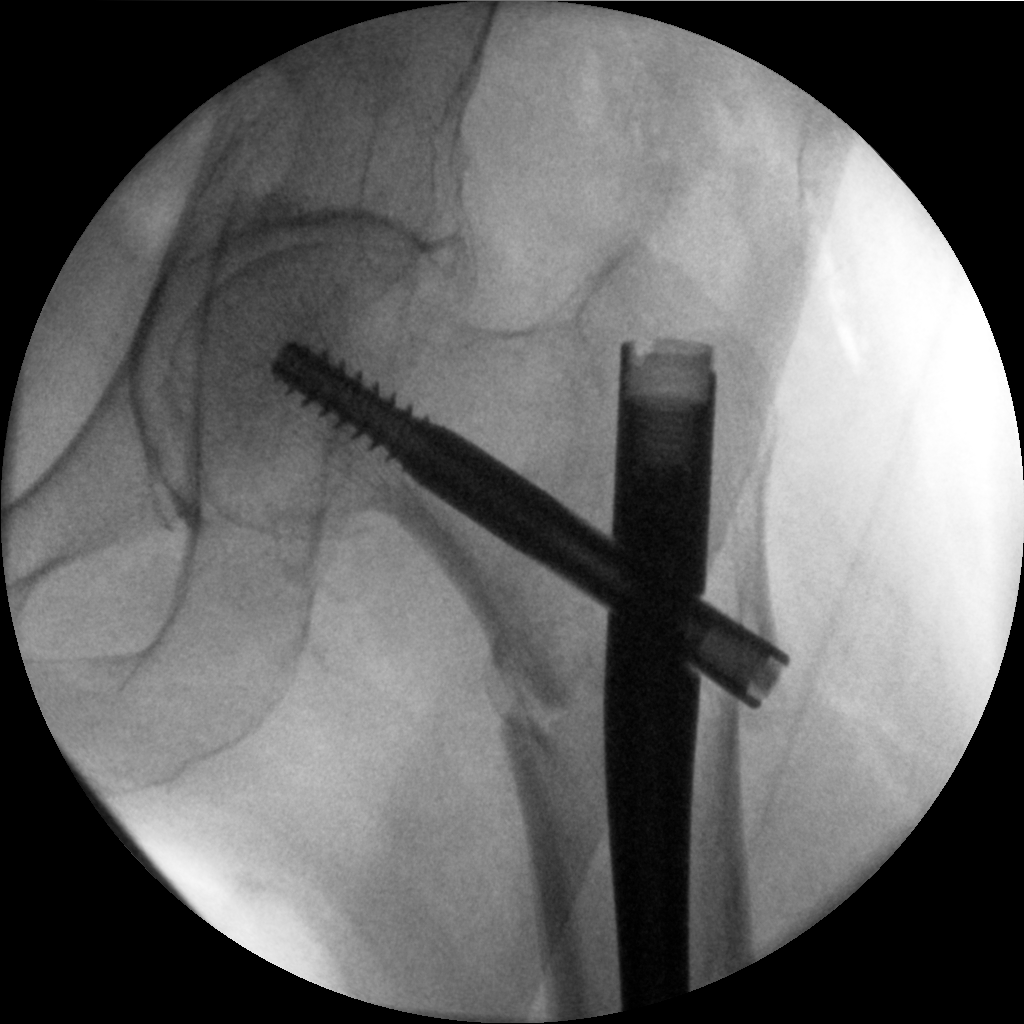

[Series 10: cont. · 1 of 1 slices shown (2 of 5)]
[im 1/1]
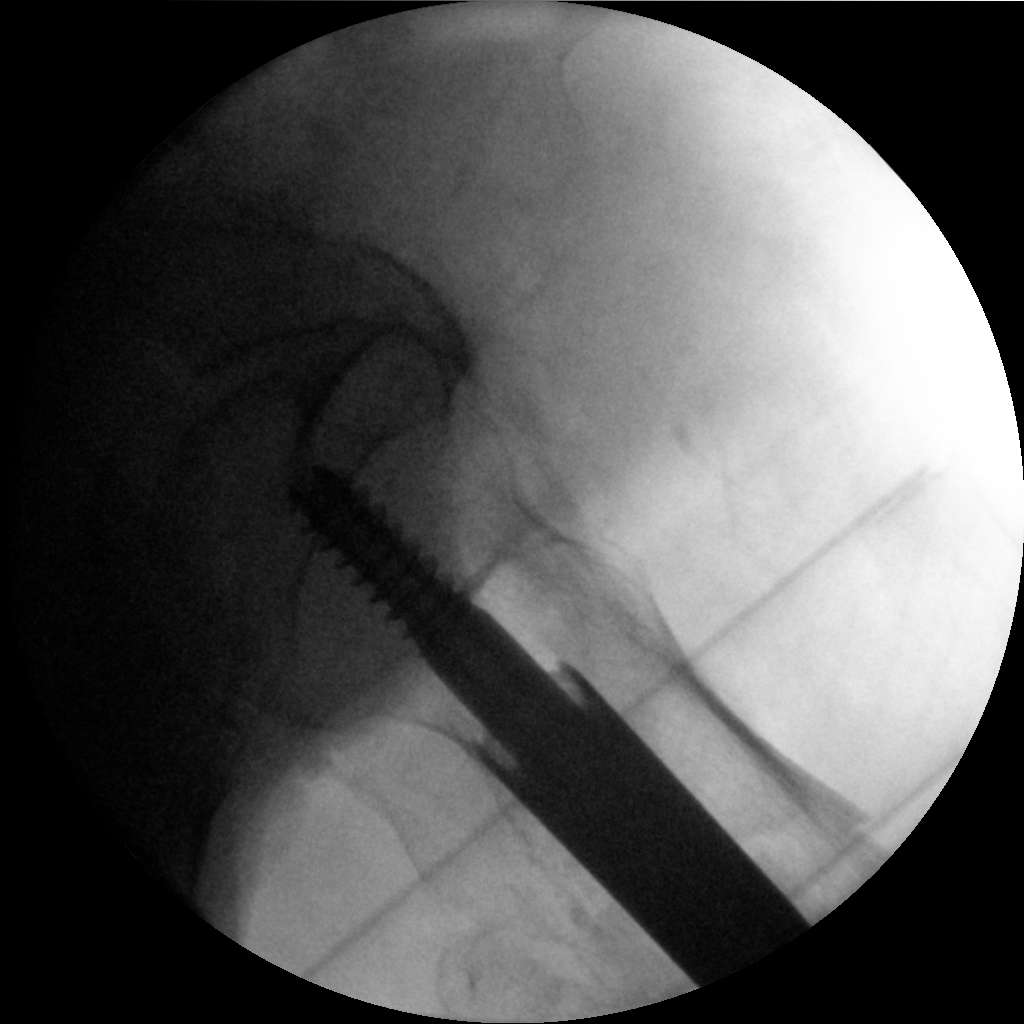

[Series 11: cont. · 1 of 1 slices shown (3 of 5)]
[im 1/1]
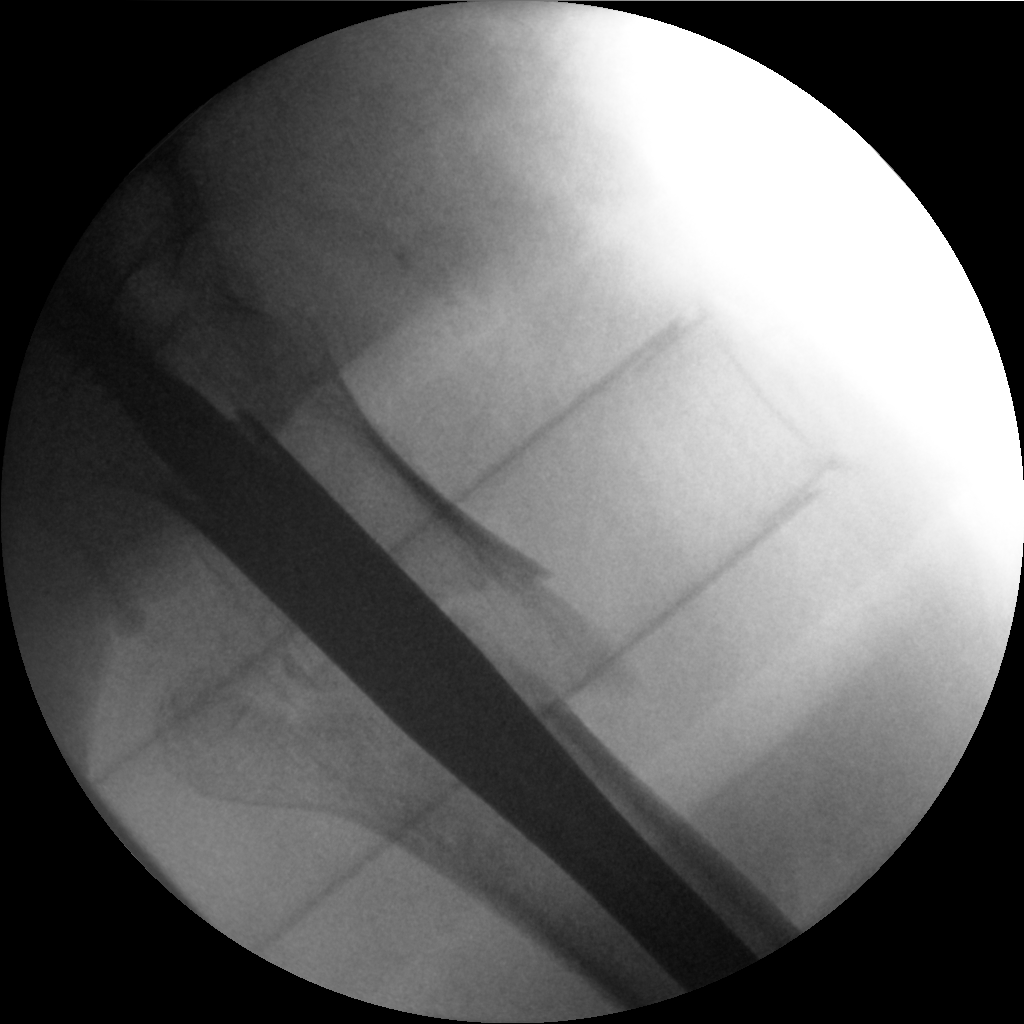

[Series 13: cont. · 1 of 1 slices shown (4 of 5)]
[im 1/1]
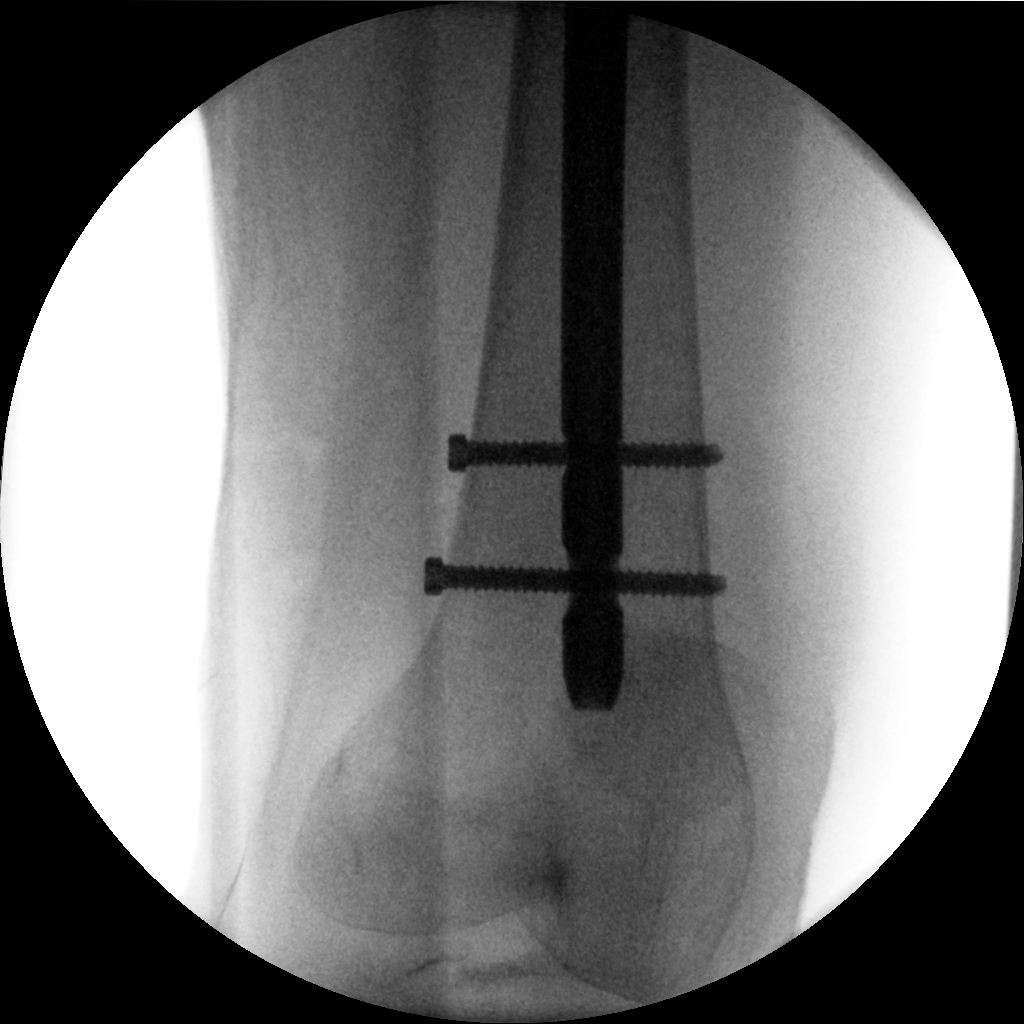

[Series 14: cont. · 1 of 1 slices shown (5 of 5)]
[im 1/1]
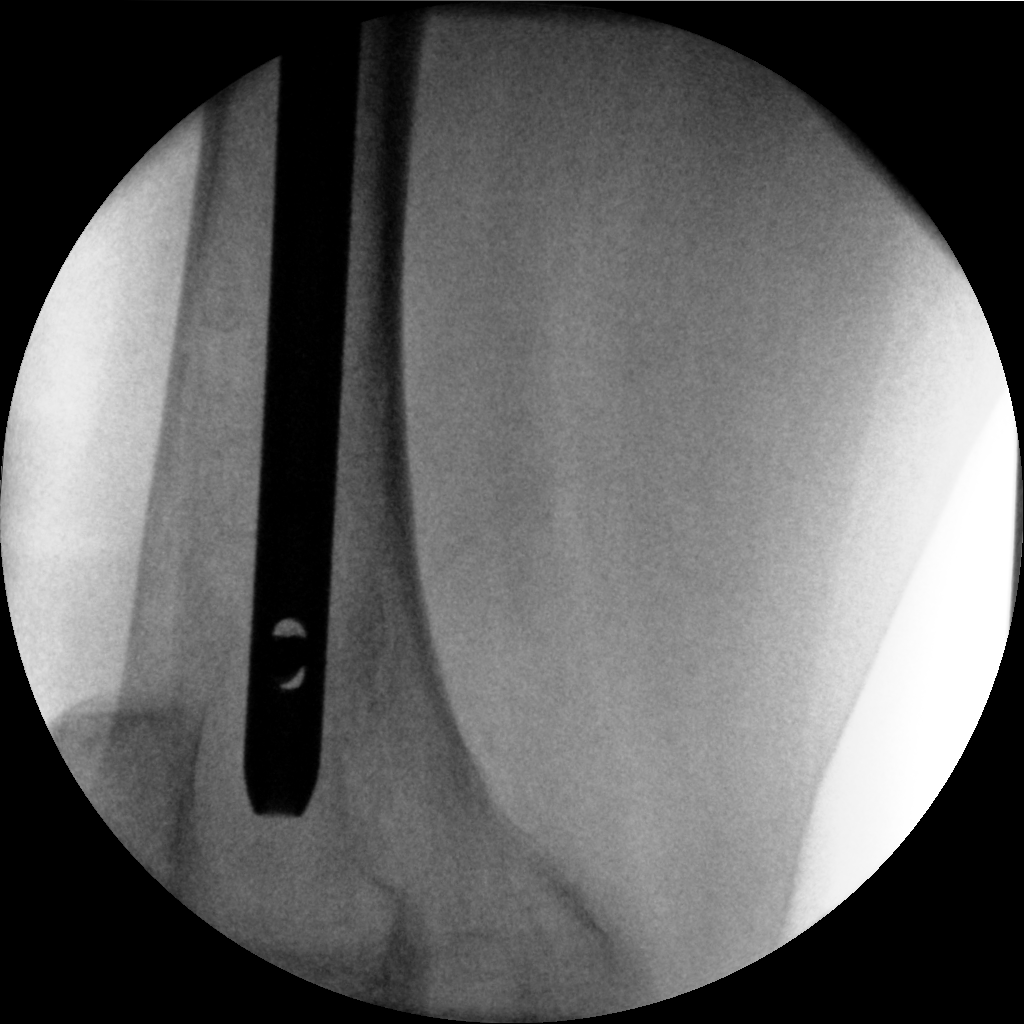

[5 of 5 positions shown; findings below may reference images not displayed]

FINDINGS: Five low resolution intraoperative spot views of the left hip. Total
fluoroscopy time was 3 minutes 12 seconds. Images demonstrate
intramedullary rod and distal screw fixation of the left femur for
low trochanteric fracture.
IMPRESSION: Intraoperative fluoroscopic assistance provided during surgical
fixation of left femur fracture

## 2020-05-29 IMAGING — DX DG CHEST 1V
1 series · 1 of 1 positions shown · non-contrast
Comparison: November 16, 2018

CLINICAL DATA: Fever.

EXAM:
CHEST  1 VIEW

[chest ap]
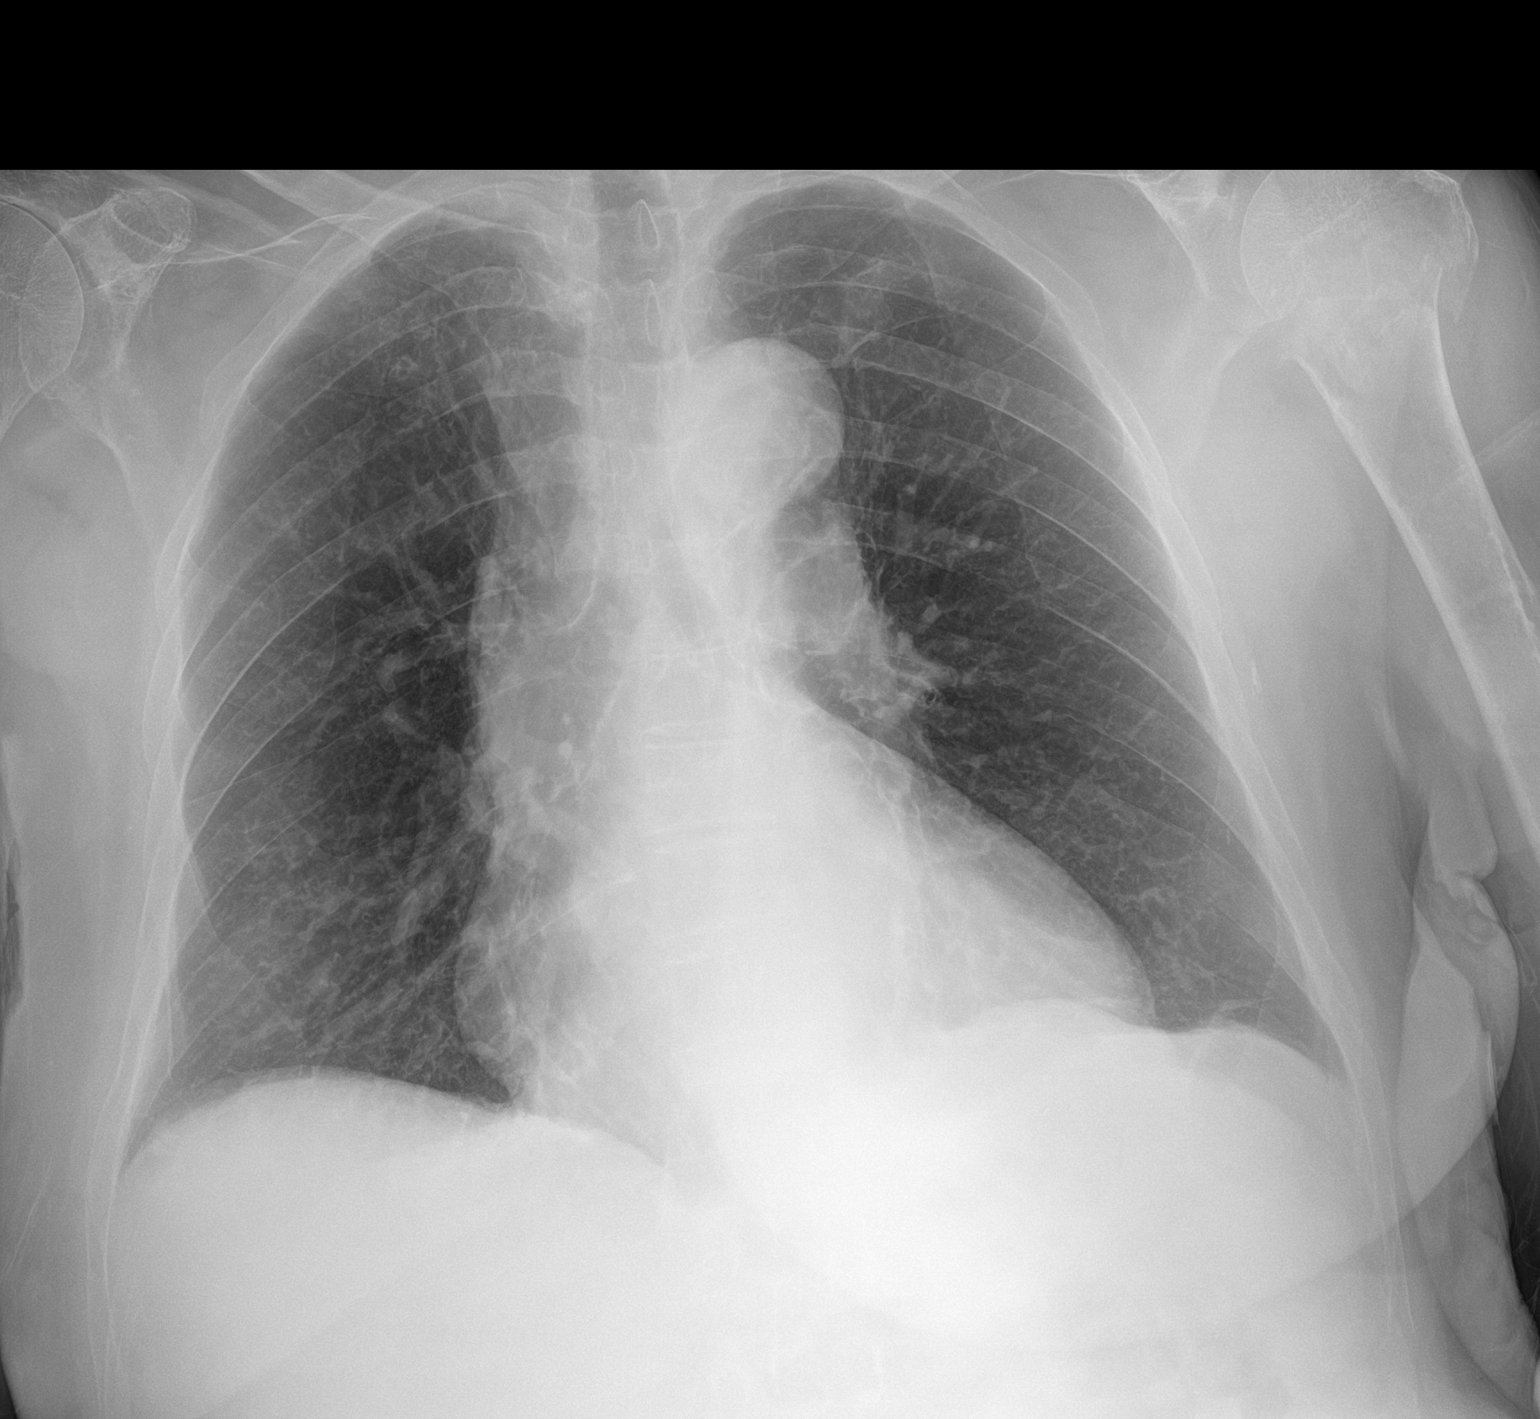

[1 of 1 positions shown; findings below may reference images not displayed]

FINDINGS: The heart, hila, mediastinum, lungs, and pleura are unremarkable. No
acute abnormalities.
IMPRESSION: No cause for fever identified. Known left proximal humeral fracture.

## 2021-07-18 ENCOUNTER — Ambulatory Visit
Admission: EM | Admit: 2021-07-18 | Discharge: 2021-07-18 | Disposition: A | Discharge status: Home / Self Care | Payer: Medicare HMO | Attending: Emergency Medicine | Admitting: Emergency Medicine

## 2021-07-18 ENCOUNTER — Other Ambulatory Visit: Payer: Self-pay

## 2021-07-18 DIAGNOSIS — L03113 Cellulitis of right upper limb: Secondary | ICD-10-CM | POA: Diagnosis not present

## 2021-07-18 MED ORDER — DOXYCYCLINE HYCLATE 100 MG PO CAPS: 100.0000 mg | ORAL_CAPSULE | Freq: Two times a day (BID) | ORAL | 0 refills | Status: AC

## 2021-07-18 MED ORDER — DOXYCYCLINE HYCLATE 100 MG PO CAPS: 100.0000 mg | ORAL_CAPSULE | Freq: Two times a day (BID) | ORAL | 0 refills | Status: DC

## 2021-07-18 NOTE — ED Triage Notes (Signed)
Patient with insect bite to right forearm and elbow that started on Saturday and has continued to worsen over the last day. Patient states that it is painful to the touch.

## 2021-07-18 NOTE — Discharge Instructions (Addendum)
Warm compresses.  May take Tylenol as needed for pain.  Finish doxycycline, even if you feel better.  Follow-up with your doctor in several days if you are not getting any better, to the ER if you get worse, fevers above 100.4, or further concerns

## 2021-07-18 NOTE — ED Provider Notes (Signed)
HPI  SUBJECTIVE:  Cassandra Maxwell is a 85 y.o. female who presents with 2 erythematous masses on her right forearm starting about a week ago.  Patient is not sure if it is changed in size recently.  Daughter states that she was made aware of this yesterday.  Patient states it is painful when it is touched only.  No trauma to the area, patient does not recall an insect bite.  No fevers, body aches, abdominal pain, drainage, itching, burning pain.  She tried Neosporin without improvement in her symptoms.  Symptoms are worse with palpation.  Her tetanus is up-to-date.  She has a past medical history of DVT and is on Xarelto.  She has a history of breast cancer.  No history of MRSA, abscesses, diabetes, chronic kidney disease.  PMD: [facility].  Additional history obtained from daughter-patient has difficulty with memory..  Past Medical History:  Diagnosis Date   Breast cancer ([place])    H/O blood clots    Hypertension    H/O.  currently not on meds   Memory difficulties    Wears dentures     Past Surgical History:  Procedure Laterality Date   CATARACT EXTRACTION W/PHACO Right 03/14/2020   Procedure: CATARACT EXTRACTION PHACO AND INTRAOCULAR LENS PLACEMENT (IOC) RIGHT 26.78 02:11.7;  Surgeon: Birder Robson, MD;  Location: [place];  Service: Ophthalmology;  Laterality: Right;   CATARACT EXTRACTION W/PHACO Left 04/04/2020   Procedure: CATARACT EXTRACTION PHACO AND INTRAOCULAR LENS PLACEMENT (IOC) Left 16.30 01:21.4;  Surgeon: Birder Robson, MD;  Location: [place];  Service: Ophthalmology;  Laterality: Left;   INTRAMEDULLARY (IM) NAIL INTERTROCHANTERIC Left 11/26/2018   Procedure: INTRAMEDULLARY (IM) NAIL INTERTROCHANTRIC;  Surgeon: Leim Fabry, MD;  Location: [facility];  Service: Orthopedics;  Laterality: Left;    Family History  Problem Relation Age of Onset   CAD Mother     Social History   Tobacco Use   Smoking status: Every Day    Packs/day: 1.00     Years: 45.00    Pack years: 45.00    Types: Cigarettes   Smokeless tobacco: Never   Tobacco comments:    since her 24s  Vaping Use   Vaping Use: Never used  Substance Use Topics   Alcohol use: Never   Drug use: Never    No current facility-administered medications for this encounter.  Current Outpatient Medications:    amLODipine (NORVASC) 5 MG tablet, Take 5 mg by mouth daily., Disp: , Rfl:    Cyanocobalamin (VITAMIN B-12 PO), Take by mouth daily., Disp: , Rfl:    loratadine (CLARITIN) 10 MG tablet, Take 10 mg by mouth daily as needed for allergies., Disp: , Rfl:    rivaroxaban (XARELTO) 10 MG TABS tablet, Take 10 mg by mouth every evening., Disp: , Rfl:    doxycycline (VIBRAMYCIN) 100 MG capsule, Take 1 capsule (100 mg total) by mouth 2 (two) times daily for 7 days., Disp: 14 capsule, Rfl: 0  No Known Allergies   ROS  As noted in HPI.   Physical Exam  BP 136/63 (BP Location: Left Arm)   Pulse 74   Temp 98.7 F (37.1 C) (Oral)   Resp 19   Ht '5\' 7"'$  (1.702 m)   Wt 71.7 kg   SpO2 97%   BMI 24.76 kg/m   Constitutional: Well developed, well nourished, no acute distress Eyes:  EOMI, conjunctiva normal bilaterally HENT: Normocephalic, atraumatic,mucus membranes moist Respiratory: Normal inspiratory effort Cardiovascular: Normal rate GI: nondistended skin:  Mildly tender erythematous mass measuring 6 x 5 cm medial right elbow without fluctuance.  Marked area of erythema with a marker for reference.   7 x 5 cm minimally tender mass and area of erythema inferior medial right forearm with a central eschar.  No fluctuance.  No expressible purulent drainage.  Marked area of erythema with a marker for reference    No tenderness, lymphangitis extending up arm.   Musculoskeletal: no deformities Neurologic: Alert & oriented x 3, no focal neuro deficits Psychiatric: Speech and behavior appropriate   ED Course   Medications - No data to display  No orders of the defined  types were placed in this encounter.   No results found for this or any previous visit (from the past 24 hour(s)). No results found.  ED Clinical Impression  1. Cellulitis of right upper extremity      ED Assessment/Plan  Presentation consistent with a cellulitis, perhaps early abscess.  This could be insect bites, with a secondary infection.  There does not appear to be anything to I&D at this time.  Will send home with warm compresses, doxycycline for 7 days.  Follow-up with PMD if not getting any better, to the ER if she gets worse.  Discussed MDM, treatment plan, and plan for follow-up with patient and daughter.  Discussed sn/sx that should prompt return to the ED. they agree with plan.  Meds ordered this encounter  Medications   DISCONTD: doxycycline (VIBRAMYCIN) 100 MG capsule    Sig: Take 1 capsule (100 mg total) by mouth 2 (two) times daily for 7 days.    Dispense:  10 capsule    Refill:  0   doxycycline (VIBRAMYCIN) 100 MG capsule    Sig: Take 1 capsule (100 mg total) by mouth 2 (two) times daily for 7 days.    Dispense:  14 capsule    Refill:  0      *This clinic note was created using [employer]. Therefore, there may be occasional mistakes despite careful proofreading.  ?    Melynda Ripple, MD 07/18/21 [phone]

## 2022-01-26 ENCOUNTER — Other Ambulatory Visit: Payer: Self-pay

## 2022-01-26 ENCOUNTER — Ambulatory Visit
Admission: EM | Admit: 2022-01-26 | Discharge: 2022-01-26 | Disposition: A | Discharge status: Home / Self Care | Payer: Medicare HMO | Attending: Emergency Medicine | Admitting: Emergency Medicine

## 2022-01-26 DIAGNOSIS — L03116 Cellulitis of left lower limb: Secondary | ICD-10-CM | POA: Diagnosis not present

## 2022-01-26 MED ORDER — DOXYCYCLINE HYCLATE 100 MG PO CAPS: 100.0000 mg | ORAL_CAPSULE | Freq: Two times a day (BID) | ORAL | 0 refills | Status: AC

## 2022-01-26 NOTE — ED Provider Notes (Signed)
[facility]    CSN: [id number] Arrival date & time: 01/26/22  1453      History   Chief Complaint Chief Complaint  Patient presents with   Wound Check    HPI SIYANA ERNEY is a 86 y.o. female.   HPI  86 year old female here for a wound check.  Patient reports that she was attacked by her wrist 5 to 6 days ago and she sustained 3 lacerations to her left lower leg.  She has a laceration in the front, in the back, and on the outside.  She has not had any fever, drainage from the wounds, red streak going up her leg.  Her granddaughter was assisting her to shower this morning and she was concerned because the wound on the front of her leg had what she thought might be a blister and some redness and swelling.  She does have some mild swelling to her lower extremity at baseline and has significant varicosities and spider veins to both lower extremities.  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  Past Medical History:  Diagnosis Date   Breast cancer ([place])    H/O blood clots    Hypertension    H/O.  currently not on meds   Memory difficulties    Wears dentures     Patient Active Problem List   Diagnosis Date Noted   Hip fracture requiring operative repair, left, closed, initial encounter ([place]) 11/26/2018    Past Surgical History:  Procedure Laterality Date   CATARACT EXTRACTION W/PHACO Right 03/14/2020   Procedure: CATARACT EXTRACTION PHACO AND INTRAOCULAR LENS PLACEMENT (IOC) RIGHT 26.78 02:11.7;  Surgeon: Birder Robson, MD;  Location: [place];  Service: Ophthalmology;  Laterality: Right;   CATARACT EXTRACTION W/PHACO Left 04/04/2020   Procedure: CATARACT EXTRACTION PHACO AND INTRAOCULAR LENS PLACEMENT (IOC) Left 16.30 01:21.4;  Surgeon: Birder Robson, MD;  Location: [place];  Service: Ophthalmology;  Laterality: Left;   INTRAMEDULLARY (IM) NAIL INTERTROCHANTERIC Left 11/26/2018   Procedure: INTRAMEDULLARY (IM) NAIL  INTERTROCHANTRIC;  Surgeon: Leim Fabry, MD;  Location: [facility];  Service: Orthopedics;  Laterality: Left;    OB History   No obstetric history on file.      Home Medications    Prior to Admission medications   Medication Sig Start Date End Date Taking? Authorizing Provider  doxycycline (VIBRAMYCIN) 100 MG capsule Take 1 capsule (100 mg total) by mouth 2 (two) times daily. 01/26/22  Yes Margarette Canada, NP  amLODipine (NORVASC) 5 MG tablet Take 5 mg by mouth daily.    [provider]  Cyanocobalamin (VITAMIN B-12 PO) Take by mouth daily.    [provider]  loratadine (CLARITIN) 10 MG tablet Take 10 mg by mouth daily as needed for allergies.    [provider]  rivaroxaban (XARELTO) 10 MG TABS tablet Take 10 mg by mouth every evening.    [provider]    Family History Family History  Problem Relation Age of Onset   CAD Mother     Social History Social History   Tobacco Use   Smoking status: Every Day    Packs/day: 1.00    Years: 45.00    Pack years: 45.00    Types: Cigarettes   Smokeless tobacco: Never   Tobacco comments:    since her 62s  Vaping Use   Vaping Use: Never used  Substance Use Topics   Alcohol use: Never   Drug use: Never  Allergies   Patient has no known allergies.   Review of Systems Review of Systems  Constitutional:  Negative for fever.  Skin:  Positive for color change and wound.  Hematological: Negative.   Psychiatric/Behavioral: Negative.      Physical Exam Triage Vital Signs ED Triage Vitals [01/26/22 1549]  Enc Vitals Group     BP 130/88     Pulse Rate 98     Resp 18     Temp 97.8 F (36.6 C)     Temp Source Oral     SpO2 100 %     Weight      Height      Head Circumference      Peak Flow      Pain Score      Pain Loc      Pain Edu?      Excl. in [place]?    No data found.  Updated Vital Signs BP 130/88 (BP Location: Left Arm)    Pulse 98    Temp 97.8 F (36.6 C) (Oral)    Resp  18    SpO2 100%   Visual Acuity Right Eye Distance:   Left Eye Distance:   Bilateral Distance:    Right Eye Near:   Left Eye Near:    Bilateral Near:     Physical Exam Vitals and nursing note reviewed.  Constitutional:      General: She is not in acute distress.    Appearance: Normal appearance. She is normal weight. She is not ill-appearing.  HENT:     Head: Normocephalic and atraumatic.  Musculoskeletal:        General: Swelling present. No tenderness.  Skin:    Capillary Refill: Capillary refill takes 2 to 3 seconds.     Findings: Erythema present.  Neurological:     General: No focal deficit present.     Mental Status: She is alert and oriented to person, place, and time.  Psychiatric:        Mood and Affect: Mood normal.        Behavior: Behavior normal.        Thought Content: Thought content normal.        Judgment: Judgment normal.     UC Treatments / Results  Labs (all labs ordered are listed, but only abnormal results are displayed) Labs Reviewed - No data to display  EKG   Radiology No results found.  Procedures Procedures (including critical care time)  Medications Ordered in UC Medications - No data to display  Initial Impression / Assessment and Plan / UC Course  I have reviewed the triage vital signs and the nursing notes.  Pertinent labs & imaging results that were available during my care of the patient were reviewed by me and considered in my medical decision making (see chart for details).  Patient is a very pleasant, nontoxic-appearing 86 year old female here for evaluation of 3 scratches that she sustained to her left lower extremity from a rooster 5 to 6 days ago.  She has not had a fever, drainage, or red streaks going up her leg but the wound on the front of her left shin does have some mild erythema around the scab that is in place.  There is also some mild ecchymosis inferior and lateral to the distal wound edge.  The wounds are not  hot to touch.  The second wound is on the lateral aspect and is a linear scratch that is scabbed over with  mild surrounding erythema and the third wound is circular on the posterior aspect of the left lower leg.  It to has a scab in place with mild erythema around the scab.  The lower extremity is mildly edematous but it is not pitting.  She has edema both lower extremities as well as significant spider vein and varicosities.  She does have a history of blood clots and is on Xarelto.  Her family brought her in because her concerned that she might be developing infection.  The DP and PT pulse of the left foot are 2+.  There is concern that there is some developing cellulitis on the anterior lesion and I will place the patient on doxycycline twice daily for 10 days for treatment of cellulitis.  ER and return precautions reviewed with patient and family.   Final Clinical Impressions(s) / UC Diagnoses   Final diagnoses:  Cellulitis of left lower extremity     Discharge Instructions      Take the Doxycycline twice daily with food for 10 days.  Doxycycline will make you more sensitive to sunburn so wear sunscreen when outdoors and reapply it every 90 minutes.  Keep the wounds clean and dry..  Use OTC Tylenol and Ibuprofen according to the package instructions as needed for pain.  Return for new or worsening symptoms.       ED Prescriptions     Medication Sig Dispense Auth. Provider   doxycycline (VIBRAMYCIN) 100 MG capsule Take 1 capsule (100 mg total) by mouth 2 (two) times daily. 20 capsule Margarette Canada, NP      PDMP not reviewed this encounter.   Margarette Canada, NP 01/26/22 1624

## 2022-01-26 NOTE — ED Triage Notes (Signed)
Pt here with C?O left leg infection. Rooster at home attacked her leg on Monday.

## 2022-01-26 NOTE — Discharge Instructions (Signed)
Take the Doxycycline twice daily with food for 10 days.  Doxycycline will make you more sensitive to sunburn so wear sunscreen when outdoors and reapply it every 90 minutes.  Keep the wounds clean and dry..  Use OTC Tylenol and Ibuprofen according to the package instructions as needed for pain.  Return for new or worsening symptoms.
